# Patient Record
Sex: Female | Born: 1935 | Race: White | Hispanic: No | Smoking: Never smoker
Health system: Southern US, Community
[De-identification: ages and names within clinical notes are randomized; demographics above are authoritative.]

## PROBLEM LIST (undated history)

## (undated) DIAGNOSIS — C801 Malignant (primary) neoplasm, unspecified: Secondary | ICD-10-CM

## (undated) DIAGNOSIS — C50919 Malignant neoplasm of unspecified site of unspecified female breast: Secondary | ICD-10-CM

## (undated) HISTORY — PX: ABDOMINAL HYSTERECTOMY: SUR658

## (undated) HISTORY — PX: MASTECTOMY: SHX3

---

## 2004-08-16 ENCOUNTER — Ambulatory Visit: Payer: Self-pay | Admitting: Unknown Physician Specialty

## 2004-08-19 ENCOUNTER — Ambulatory Visit: Payer: Self-pay | Admitting: Unknown Physician Specialty

## 2007-12-12 ENCOUNTER — Ambulatory Visit: Payer: Self-pay | Admitting: Internal Medicine

## 2008-02-26 ENCOUNTER — Ambulatory Visit: Payer: Self-pay | Admitting: Unknown Physician Specialty

## 2008-04-02 ENCOUNTER — Inpatient Hospital Stay: Payer: Self-pay | Admitting: Cardiology

## 2008-04-02 ENCOUNTER — Other Ambulatory Visit: Payer: Self-pay

## 2008-04-03 ENCOUNTER — Other Ambulatory Visit: Payer: Self-pay

## 2008-04-17 ENCOUNTER — Ambulatory Visit: Payer: Self-pay | Admitting: Cardiology

## 2008-06-24 ENCOUNTER — Ambulatory Visit: Payer: Self-pay | Admitting: Family Medicine

## 2009-04-15 ENCOUNTER — Ambulatory Visit: Payer: Self-pay | Admitting: Internal Medicine

## 2010-01-21 ENCOUNTER — Ambulatory Visit: Payer: Self-pay | Admitting: Internal Medicine

## 2010-08-23 ENCOUNTER — Ambulatory Visit: Payer: Self-pay | Admitting: Internal Medicine

## 2011-02-04 ENCOUNTER — Observation Stay: Payer: Self-pay | Admitting: Internal Medicine

## 2011-02-04 ENCOUNTER — Ambulatory Visit: Payer: Self-pay | Admitting: Internal Medicine

## 2011-04-18 ENCOUNTER — Ambulatory Visit: Payer: Self-pay | Admitting: Oncology

## 2011-04-22 ENCOUNTER — Ambulatory Visit: Payer: Self-pay | Admitting: Internal Medicine

## 2011-04-24 ENCOUNTER — Ambulatory Visit: Payer: Self-pay | Admitting: Oncology

## 2011-05-25 ENCOUNTER — Ambulatory Visit: Payer: Self-pay | Admitting: Oncology

## 2011-06-13 ENCOUNTER — Ambulatory Visit: Payer: Self-pay | Admitting: Unknown Physician Specialty

## 2011-06-17 ENCOUNTER — Telehealth: Payer: Self-pay

## 2011-06-17 ENCOUNTER — Ambulatory Visit: Payer: Self-pay | Admitting: Oncology

## 2011-06-17 DIAGNOSIS — C169 Malignant neoplasm of stomach, unspecified: Secondary | ICD-10-CM

## 2011-06-17 NOTE — Telephone Encounter (Signed)
PT DAUGHTER HAS BEEN NOTIFIED OF PROCEDURE MEDS REVIEWED AND PT INSTRUCTED.

## 2011-06-20 ENCOUNTER — Encounter: Payer: Medicare Other | Admitting: Gastroenterology

## 2011-06-20 ENCOUNTER — Ambulatory Visit (HOSPITAL_COMMUNITY)
Admission: RE | Admit: 2011-06-20 | Discharge: 2011-06-20 | Disposition: A | Discharge status: Home / Self Care | Payer: Medicare Other | Source: Ambulatory Visit | Attending: Gastroenterology | Admitting: Gastroenterology

## 2011-06-20 DIAGNOSIS — R933 Abnormal findings on diagnostic imaging of other parts of digestive tract: Secondary | ICD-10-CM

## 2011-06-20 DIAGNOSIS — C169 Malignant neoplasm of stomach, unspecified: Secondary | ICD-10-CM

## 2011-06-20 DIAGNOSIS — C165 Malignant neoplasm of lesser curvature of stomach, unspecified: Secondary | ICD-10-CM | POA: Insufficient documentation

## 2011-06-25 ENCOUNTER — Ambulatory Visit: Payer: Self-pay | Admitting: Oncology

## 2011-07-03 ENCOUNTER — Inpatient Hospital Stay: Payer: Self-pay | Admitting: Internal Medicine

## 2011-07-12 ENCOUNTER — Ambulatory Visit: Payer: Self-pay | Admitting: Surgery

## 2011-07-25 ENCOUNTER — Ambulatory Visit: Payer: Self-pay | Admitting: Oncology

## 2011-08-25 ENCOUNTER — Ambulatory Visit: Payer: Self-pay | Admitting: Oncology

## 2011-08-31 ENCOUNTER — Ambulatory Visit: Payer: Self-pay | Admitting: Oncology

## 2011-09-24 ENCOUNTER — Ambulatory Visit: Payer: Self-pay | Admitting: Oncology

## 2011-10-25 ENCOUNTER — Ambulatory Visit: Payer: Self-pay | Admitting: Oncology

## 2011-11-04 LAB — COMPREHENSIVE METABOLIC PANEL
Albumin: 3.3 g/dL — ABNORMAL LOW (ref 3.4–5.0)
Anion Gap: 3 — ABNORMAL LOW (ref 7–16)
BUN: 13 mg/dL (ref 7–18)
Bilirubin,Total: 0.3 mg/dL (ref 0.2–1.0)
Chloride: 97 mmol/L — ABNORMAL LOW (ref 98–107)
Creatinine: 0.7 mg/dL (ref 0.60–1.30)
EGFR (African American): >60
Glucose: 122 mg/dL — ABNORMAL HIGH (ref 65–99)
Osmolality: 264 (ref 275–301)
Potassium: 4.1 mmol/L (ref 3.5–5.1)
SGOT(AST): 23 U/L (ref 15–37)
SGPT (ALT): 15 U/L
Sodium: 131 mmol/L — ABNORMAL LOW (ref 136–145)
Total Protein: 6.4 g/dL (ref 6.4–8.2)

## 2011-11-04 LAB — CBC CANCER CENTER
Basophil #: 0 x10 3/mm (ref 0.0–0.1)
Basophil %: 0.2 %
Eosinophil #: 0.1 x10 3/mm (ref 0.0–0.7)
HCT: 28.8 % — ABNORMAL LOW (ref 35.0–47.0)
HGB: 9.9 g/dL — ABNORMAL LOW (ref 12.0–16.0)
Lymphocyte #: 0.6 x10 3/mm — ABNORMAL LOW (ref 1.0–3.6)
Lymphocyte %: 20 %
MCH: 34.8 pg — ABNORMAL HIGH (ref 26.0–34.0)
MCHC: 34.4 g/dL (ref 32.0–36.0)
Monocyte #: 0.3 x10 3/mm (ref 0.0–0.7)
Neutrophil #: 2.2 x10 3/mm (ref 1.4–6.5)
RDW: 15.9 % — ABNORMAL HIGH (ref 11.5–14.5)

## 2011-11-08 ENCOUNTER — Ambulatory Visit: Payer: Self-pay | Admitting: Oncology

## 2011-11-11 LAB — COMPREHENSIVE METABOLIC PANEL
Anion Gap: 3 — ABNORMAL LOW (ref 7–16)
BUN: 8 mg/dL (ref 7–18)
Bilirubin,Total: 0.3 mg/dL (ref 0.2–1.0)
Chloride: 97 mmol/L — ABNORMAL LOW (ref 98–107)
Co2: 33 mmol/L — ABNORMAL HIGH (ref 21–32)
Creatinine: 0.72 mg/dL (ref 0.60–1.30)
EGFR (African American): >60
EGFR (Non-African Amer.): >60
Osmolality: 264 (ref 275–301)
Potassium: 4.2 mmol/L (ref 3.5–5.1)
SGOT(AST): 24 U/L (ref 15–37)
Total Protein: 6.6 g/dL (ref 6.4–8.2)

## 2011-11-11 LAB — CBC CANCER CENTER
Eosinophil #: 0.1 x10 3/mm (ref 0.0–0.7)
HCT: 30.2 % — ABNORMAL LOW (ref 35.0–47.0)
Lymphocyte #: 0.6 x10 3/mm — ABNORMAL LOW (ref 1.0–3.6)
MCH: 35.2 pg — ABNORMAL HIGH (ref 26.0–34.0)
MCHC: 34.7 g/dL (ref 32.0–36.0)
Monocyte #: 0.3 x10 3/mm (ref 0.0–0.7)
Monocyte %: 11.3 %
Neutrophil %: 64.3 %
Platelet: 152 x10 3/mm (ref 150–440)
RBC: 2.98 10*6/uL — ABNORMAL LOW (ref 3.80–5.20)

## 2011-11-11 LAB — MAGNESIUM: Magnesium: 2.3 mg/dL

## 2011-11-18 LAB — BASIC METABOLIC PANEL
Anion Gap: 6 — ABNORMAL LOW (ref 7–16)
Chloride: 94 mmol/L — ABNORMAL LOW (ref 98–107)
Co2: 30 mmol/L (ref 21–32)
Creatinine: 0.82 mg/dL (ref 0.60–1.30)
EGFR (African American): >60
EGFR (Non-African Amer.): >60
Glucose: 146 mg/dL — ABNORMAL HIGH (ref 65–99)
Osmolality: 262 (ref 275–301)

## 2011-11-18 LAB — CBC CANCER CENTER
Basophil #: 0 x10 3/mm (ref 0.0–0.1)
Eosinophil #: 0 x10 3/mm (ref 0.0–0.7)
HCT: 31 % — ABNORMAL LOW (ref 35.0–47.0)
Lymphocyte #: 0.5 x10 3/mm — ABNORMAL LOW (ref 1.0–3.6)
Lymphocyte %: 20.1 %
MCHC: 34.2 g/dL (ref 32.0–36.0)
MCV: 102.4 fL — ABNORMAL HIGH (ref 80–100)
Monocyte %: 13.9 %
RDW: 15 % — ABNORMAL HIGH (ref 11.5–14.5)
WBC: 2.6 x10 3/mm — ABNORMAL LOW (ref 3.6–11.0)

## 2011-11-25 ENCOUNTER — Ambulatory Visit: Payer: Self-pay | Admitting: Oncology

## 2011-11-25 ENCOUNTER — Ambulatory Visit: Payer: Self-pay | Admitting: Internal Medicine

## 2011-12-02 LAB — CBC CANCER CENTER
Basophil #: 0 x10 3/mm (ref 0.0–0.1)
Eosinophil #: 0.1 x10 3/mm (ref 0.0–0.7)
Eosinophil %: 1.3 %
Lymphocyte #: 0.8 x10 3/mm — ABNORMAL LOW (ref 1.0–3.6)
Lymphocyte %: 18.3 %
MCH: 34 pg (ref 26.0–34.0)
MCHC: 33.6 g/dL (ref 32.0–36.0)
MCV: 101.1 fL — ABNORMAL HIGH (ref 80–100)
Monocyte #: 0.5 x10 3/mm (ref 0.0–0.7)
Neutrophil #: 2.8 x10 3/mm (ref 1.4–6.5)
Neutrophil %: 68.7 %
Platelet: 153 x10 3/mm (ref 150–440)
RBC: 3.23 10*6/uL — ABNORMAL LOW (ref 3.80–5.20)
RDW: 14.3 % (ref 11.5–14.5)
WBC: 4.2 x10 3/mm (ref 3.6–11.0)

## 2011-12-02 LAB — BASIC METABOLIC PANEL
Creatinine: 0.77 mg/dL (ref 0.60–1.30)
EGFR (African American): >60
EGFR (Non-African Amer.): >60
Glucose: 87 mg/dL (ref 65–99)
Potassium: 4.2 mmol/L (ref 3.5–5.1)
Sodium: 129 mmol/L — ABNORMAL LOW (ref 136–145)

## 2011-12-07 ENCOUNTER — Inpatient Hospital Stay: Payer: Self-pay | Admitting: Oncology

## 2011-12-08 LAB — BASIC METABOLIC PANEL
Anion Gap: 10 (ref 7–16)
BUN: 7 mg/dL (ref 7–18)
Calcium, Total: 8.3 mg/dL — ABNORMAL LOW (ref 8.5–10.1)
Chloride: 98 mmol/L (ref 98–107)
Co2: 25 mmol/L (ref 21–32)
Creatinine: 0.49 mg/dL — ABNORMAL LOW (ref 0.60–1.30)
Glucose: 85 mg/dL (ref 65–99)
Osmolality: 264 (ref 275–301)
Potassium: 3.9 mmol/L (ref 3.5–5.1)
Sodium: 133 mmol/L — ABNORMAL LOW (ref 136–145)

## 2011-12-08 LAB — CBC WITH DIFFERENTIAL/PLATELET
Basophil #: 0 10*3/uL (ref 0.0–0.1)
Eosinophil %: 2.6 %
HCT: 30.3 % — ABNORMAL LOW (ref 35.0–47.0)
HGB: 10.3 g/dL — ABNORMAL LOW (ref 12.0–16.0)
Lymphocyte #: 0.5 10*3/uL — ABNORMAL LOW (ref 1.0–3.6)
MCH: 34.2 pg — ABNORMAL HIGH (ref 26.0–34.0)
MCHC: 34 g/dL (ref 32.0–36.0)
MCV: 101 fL — ABNORMAL HIGH (ref 80–100)
Monocyte %: 6.7 %
Neutrophil #: 1.8 10*3/uL (ref 1.4–6.5)
RDW: 14.6 % — ABNORMAL HIGH (ref 11.5–14.5)

## 2011-12-09 LAB — CBC WITH DIFFERENTIAL/PLATELET
Basophil #: 0 10*3/uL (ref 0.0–0.1)
Basophil %: 0.2 %
Eosinophil %: 4.4 %
HCT: 28.5 % — ABNORMAL LOW (ref 35.0–47.0)
Lymphocyte #: 0.5 10*3/uL — ABNORMAL LOW (ref 1.0–3.6)
MCH: 33.5 pg (ref 26.0–34.0)
MCHC: 33.7 g/dL (ref 32.0–36.0)
MCV: 100 fL (ref 80–100)
Monocyte #: 0.2 10*3/uL (ref 0.0–0.7)
Neutrophil #: 2.5 10*3/uL (ref 1.4–6.5)
Platelet: 108 10*3/uL — ABNORMAL LOW (ref 150–440)
RBC: 2.86 10*6/uL — ABNORMAL LOW (ref 3.80–5.20)

## 2011-12-09 LAB — COMPREHENSIVE METABOLIC PANEL
Alkaline Phosphatase: 34 U/L — ABNORMAL LOW (ref 50–136)
Anion Gap: 8 (ref 7–16)
BUN: 6 mg/dL — ABNORMAL LOW (ref 7–18)
Bilirubin,Total: 0.2 mg/dL (ref 0.2–1.0)
Chloride: 100 mmol/L (ref 98–107)
Creatinine: 0.5 mg/dL — ABNORMAL LOW (ref 0.60–1.30)
EGFR (African American): >60
EGFR (Non-African Amer.): >60
Osmolality: 267 (ref 275–301)
SGPT (ALT): 14 U/L
Sodium: 135 mmol/L — ABNORMAL LOW (ref 136–145)
Total Protein: 5.7 g/dL — ABNORMAL LOW (ref 6.4–8.2)

## 2011-12-16 LAB — BASIC METABOLIC PANEL
Anion Gap: 6 — ABNORMAL LOW (ref 7–16)
Calcium, Total: 8.4 mg/dL — ABNORMAL LOW (ref 8.5–10.1)
Co2: 31 mmol/L (ref 21–32)
EGFR (Non-African Amer.): >60
Osmolality: 264 (ref 275–301)
Sodium: 132 mmol/L — ABNORMAL LOW (ref 136–145)

## 2011-12-16 LAB — CBC CANCER CENTER
Basophil %: 0.4 %
Eosinophil #: 0.1 x10 3/mm (ref 0.0–0.7)
Eosinophil %: 3.9 %
HCT: 31.2 % — ABNORMAL LOW (ref 35.0–47.0)
HGB: 10.5 g/dL — ABNORMAL LOW (ref 12.0–16.0)
Lymphocyte %: 21.7 %
MCH: 34.1 pg — ABNORMAL HIGH (ref 26.0–34.0)
MCHC: 33.6 g/dL (ref 32.0–36.0)
Monocyte #: 0.3 x10 3/mm (ref 0.0–0.7)
Neutrophil %: 61.3 %
Platelet: 131 x10 3/mm — ABNORMAL LOW (ref 150–440)

## 2011-12-23 ENCOUNTER — Ambulatory Visit: Payer: Self-pay | Admitting: Internal Medicine

## 2011-12-23 ENCOUNTER — Ambulatory Visit: Payer: Self-pay | Admitting: Oncology

## 2011-12-23 LAB — CBC CANCER CENTER
Basophil #: 0 x10 3/mm (ref 0.0–0.1)
Eosinophil %: 1.9 %
HCT: 33.7 % — ABNORMAL LOW (ref 35.0–47.0)
HGB: 11.3 g/dL — ABNORMAL LOW (ref 12.0–16.0)
Lymphocyte %: 19.6 %
Monocyte %: 4.1 %
Neutrophil #: 2 x10 3/mm (ref 1.4–6.5)
RBC: 3.33 10*6/uL — ABNORMAL LOW (ref 3.80–5.20)
RDW: 13.6 % (ref 11.5–14.5)
WBC: 2.6 x10 3/mm — ABNORMAL LOW (ref 3.6–11.0)

## 2011-12-23 LAB — BASIC METABOLIC PANEL
Anion Gap: 6 — ABNORMAL LOW (ref 7–16)
BUN: 7 mg/dL (ref 7–18)
Chloride: 97 mmol/L — ABNORMAL LOW (ref 98–107)
Co2: 31 mmol/L (ref 21–32)
EGFR (African American): >60
EGFR (Non-African Amer.): >60
Glucose: 104 mg/dL — ABNORMAL HIGH (ref 65–99)
Osmolality: 267 (ref 275–301)
Potassium: 3.4 mmol/L — ABNORMAL LOW (ref 3.5–5.1)
Sodium: 134 mmol/L — ABNORMAL LOW (ref 136–145)

## 2011-12-25 IMAGING — NM NUCLEAR MEDICINE CARDIAC MULTIPLE UPTAKE GATED ACQUISITION SCAN
4 series · 24 of 24 positions shown · non-contrast
Comparison: none

REASON FOR EXAM: high risk meds
COMMENTS:

[Series 1000: lao 45-gated · 3.30mm/px · 6 of 24 frames shown]
[frame 3/24]
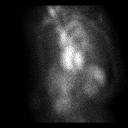
[frame 7/24]
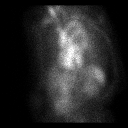
[frame 11/24]
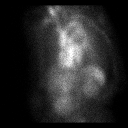
[frame 15/24]
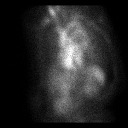
[frame 19/24]
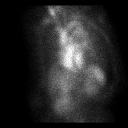
[frame 23/24]
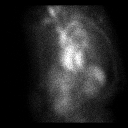

[Series 1000: ant-gated · 3.30mm/px · 6 of 24 frames shown]
[frame 3/24]
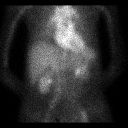
[frame 7/24]
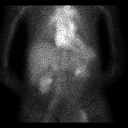
[frame 11/24]
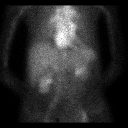
[frame 15/24]
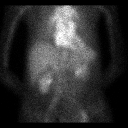
[frame 19/24]
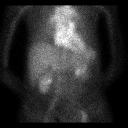
[frame 23/24]
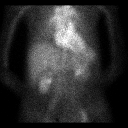

[Series 1000: lao 70-gated · 3.30mm/px · 6 of 24 frames shown]
[frame 3/24]
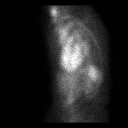
[frame 7/24]
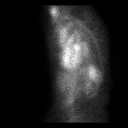
[frame 11/24]
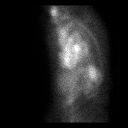
[frame 15/24]
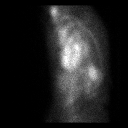
[frame 19/24]
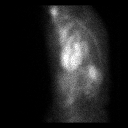
[frame 23/24]
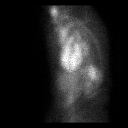

[Series 1000: lao 45-gated (results) · 3.30mm/px · 6 of 24 frames shown]
[frame 3/24]
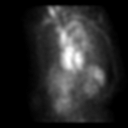
[frame 7/24]
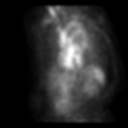
[frame 11/24]
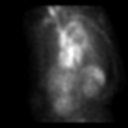
[frame 15/24]
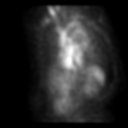
[frame 19/24]
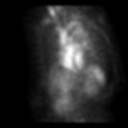
[frame 23/24]
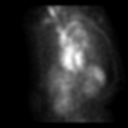

[24 of 24 positions shown; findings below may reference images not displayed]

PROCEDURE:     KNM - KNM REST MUGA SCAN [DATE] OF [DATE]  - [DATE] [DATE] [DATE]  [DATE]

RESULT:     Following intravenous administration of 3 ml PYP and 24.257 mCi
technetium 99m pertechnetate, rest MUGA scan was performed. Review of the
left ventricular cine loop shows no akinetic or dyskinetic myocardial
segments. The left ventricular ejection fraction measures 60.1%, which is in
the normal range.
IMPRESSION: 1.     Normal study. The left ventricular ejection fraction measures 60.1%.

## 2011-12-30 LAB — CBC CANCER CENTER
Basophil %: 0.3 %
Eosinophil %: 2.5 %
HCT: 29 % — ABNORMAL LOW (ref 35.0–47.0)
HGB: 9.9 g/dL — ABNORMAL LOW (ref 12.0–16.0)
Lymphocyte #: 0.5 x10 3/mm — ABNORMAL LOW (ref 1.0–3.6)
MCH: 34.2 pg — ABNORMAL HIGH (ref 26.0–34.0)
MCV: 100.1 fL — ABNORMAL HIGH (ref 80–100)
Monocyte #: 0.2 x10 3/mm (ref 0.0–0.7)
Monocyte %: 9.6 %
Neutrophil #: 1.4 x10 3/mm (ref 1.4–6.5)
Neutrophil %: 66.9 %
Platelet: 92 x10 3/mm — ABNORMAL LOW (ref 150–440)

## 2011-12-30 LAB — COMPREHENSIVE METABOLIC PANEL
Albumin: 3.4 g/dL (ref 3.4–5.0)
Alkaline Phosphatase: 75 U/L (ref 50–136)
BUN: 13 mg/dL (ref 7–18)
Bilirubin,Total: 0.2 mg/dL (ref 0.2–1.0)
Calcium, Total: 8.5 mg/dL (ref 8.5–10.1)
Creatinine: 0.75 mg/dL (ref 0.60–1.30)
EGFR (African American): >60
Glucose: 104 mg/dL — ABNORMAL HIGH (ref 65–99)
Potassium: 3.8 mmol/L (ref 3.5–5.1)
SGOT(AST): 17 U/L (ref 15–37)
SGPT (ALT): 15 U/L
Total Protein: 6.5 g/dL (ref 6.4–8.2)

## 2011-12-31 LAB — CEA: CEA: 0.8 ng/mL (ref 0.0–4.7)

## 2012-01-13 LAB — CBC CANCER CENTER
Basophil #: 0 x10 3/mm (ref 0.0–0.1)
Basophil %: 0.2 %
Eosinophil #: 0 x10 3/mm (ref 0.0–0.7)
HCT: 32.2 % — ABNORMAL LOW (ref 35.0–47.0)
HGB: 10.9 g/dL — ABNORMAL LOW (ref 12.0–16.0)
Lymphocyte #: 0.8 x10 3/mm — ABNORMAL LOW (ref 1.0–3.6)
Lymphocyte %: 17.2 %
MCH: 34.2 pg — ABNORMAL HIGH (ref 26.0–34.0)
MCHC: 34 g/dL (ref 32.0–36.0)
MCV: 100.6 fL — ABNORMAL HIGH (ref 80–100)
Monocyte #: 0.5 x10 3/mm (ref 0.0–0.7)
Neutrophil #: 3.1 x10 3/mm (ref 1.4–6.5)
Neutrophil %: 70.6 %
RBC: 3.2 10*6/uL — ABNORMAL LOW (ref 3.80–5.20)
RDW: 14.3 % (ref 11.5–14.5)

## 2012-01-23 ENCOUNTER — Ambulatory Visit: Payer: Self-pay | Admitting: Oncology

## 2012-01-27 LAB — CBC CANCER CENTER
Basophil %: 0.2 %
Eosinophil #: 0.1 x10 3/mm (ref 0.0–0.7)
Eosinophil %: 1.8 %
HGB: 10.9 g/dL — ABNORMAL LOW (ref 12.0–16.0)
Lymphocyte #: 0.6 x10 3/mm — ABNORMAL LOW (ref 1.0–3.6)
Lymphocyte %: 14 %
MCH: 33.8 pg (ref 26.0–34.0)
MCHC: 33.7 g/dL (ref 32.0–36.0)
Monocyte #: 0.5 x10 3/mm (ref 0.0–0.7)
Monocyte %: 10.5 %
Neutrophil #: 3.1 x10 3/mm (ref 1.4–6.5)
Platelet: 196 x10 3/mm (ref 150–440)
RDW: 13.8 % (ref 11.5–14.5)

## 2012-01-27 LAB — COMPREHENSIVE METABOLIC PANEL
Alkaline Phosphatase: 94 U/L (ref 50–136)
Anion Gap: 4 — ABNORMAL LOW (ref 7–16)
BUN: 11 mg/dL (ref 7–18)
Bilirubin,Total: 0.2 mg/dL (ref 0.2–1.0)
Calcium, Total: 8.6 mg/dL (ref 8.5–10.1)
Chloride: 94 mmol/L — ABNORMAL LOW (ref 98–107)
Co2: 31 mmol/L (ref 21–32)
EGFR (African American): >60
EGFR (Non-African Amer.): >60
Glucose: 95 mg/dL (ref 65–99)
Sodium: 129 mmol/L — ABNORMAL LOW (ref 136–145)
Total Protein: 6.8 g/dL (ref 6.4–8.2)

## 2012-02-10 LAB — COMPREHENSIVE METABOLIC PANEL
Alkaline Phosphatase: 85 U/L (ref 50–136)
Co2: 31 mmol/L (ref 21–32)
Creatinine: 0.69 mg/dL (ref 0.60–1.30)
EGFR (Non-African Amer.): >60
Glucose: 81 mg/dL (ref 65–99)
SGOT(AST): 28 U/L (ref 15–37)
Total Protein: 6.8 g/dL (ref 6.4–8.2)

## 2012-02-10 LAB — CBC CANCER CENTER
Eosinophil #: 0.1 x10 3/mm (ref 0.0–0.7)
HCT: 30.1 % — ABNORMAL LOW (ref 35.0–47.0)
HGB: 10.2 g/dL — ABNORMAL LOW (ref 12.0–16.0)
Lymphocyte #: 0.8 x10 3/mm — ABNORMAL LOW (ref 1.0–3.6)
Lymphocyte %: 17.4 %
MCHC: 33.8 g/dL (ref 32.0–36.0)
Monocyte %: 11.8 %
Neutrophil %: 68 %
RBC: 3.04 10*6/uL — ABNORMAL LOW (ref 3.80–5.20)
RDW: 14 % (ref 11.5–14.5)
WBC: 4.4 x10 3/mm (ref 3.6–11.0)

## 2012-02-22 ENCOUNTER — Ambulatory Visit: Payer: Self-pay | Admitting: Oncology

## 2012-03-02 LAB — COMPREHENSIVE METABOLIC PANEL
Alkaline Phosphatase: 91 U/L (ref 50–136)
Bilirubin,Total: 0.2 mg/dL (ref 0.2–1.0)
Chloride: 92 mmol/L — ABNORMAL LOW (ref 98–107)
Co2: 32 mmol/L (ref 21–32)
EGFR (African American): >60
EGFR (Non-African Amer.): >60
Potassium: 4.5 mmol/L (ref 3.5–5.1)
SGOT(AST): 22 U/L (ref 15–37)
SGPT (ALT): 19 U/L

## 2012-03-02 LAB — CBC CANCER CENTER
Basophil #: 0 x10 3/mm (ref 0.0–0.1)
Eosinophil #: 0.1 x10 3/mm (ref 0.0–0.7)
HGB: 10.9 g/dL — ABNORMAL LOW (ref 12.0–16.0)
Lymphocyte #: 0.8 x10 3/mm — ABNORMAL LOW (ref 1.0–3.6)
Lymphocyte %: 18 %
MCV: 97 fL (ref 80–100)
RDW: 14 % (ref 11.5–14.5)
WBC: 4.5 x10 3/mm (ref 3.6–11.0)

## 2012-03-23 LAB — CBC CANCER CENTER
Basophil %: 0.2 %
HGB: 11.4 g/dL — ABNORMAL LOW (ref 12.0–16.0)
Lymphocyte #: 1 x10 3/mm (ref 1.0–3.6)
MCH: 32.5 pg (ref 26.0–34.0)
MCV: 97 fL (ref 80–100)
Monocyte #: 0.5 x10 3/mm (ref 0.2–0.9)
Monocyte %: 11.5 %
Neutrophil #: 3 x10 3/mm (ref 1.4–6.5)
Neutrophil %: 64.5 %
RBC: 3.51 10*6/uL — ABNORMAL LOW (ref 3.80–5.20)

## 2012-03-23 LAB — COMPREHENSIVE METABOLIC PANEL
Alkaline Phosphatase: 78 U/L (ref 50–136)
Anion Gap: 4 — ABNORMAL LOW (ref 7–16)
BUN: 10 mg/dL (ref 7–18)
Bilirubin,Total: 0.3 mg/dL (ref 0.2–1.0)
Calcium, Total: 8.5 mg/dL (ref 8.5–10.1)
Chloride: 96 mmol/L — ABNORMAL LOW (ref 98–107)
Co2: 30 mmol/L (ref 21–32)
Creatinine: 0.76 mg/dL (ref 0.60–1.30)
EGFR (Non-African Amer.): >60
Glucose: 131 mg/dL — ABNORMAL HIGH (ref 65–99)
Potassium: 4.3 mmol/L (ref 3.5–5.1)
SGOT(AST): 25 U/L (ref 15–37)
Sodium: 130 mmol/L — ABNORMAL LOW (ref 136–145)
Total Protein: 6.9 g/dL (ref 6.4–8.2)

## 2012-03-24 ENCOUNTER — Ambulatory Visit: Payer: Self-pay | Admitting: Oncology

## 2012-04-13 LAB — COMPREHENSIVE METABOLIC PANEL
Alkaline Phosphatase: 90 U/L (ref 50–136)
BUN: 10 mg/dL (ref 7–18)
Bilirubin,Total: 0.2 mg/dL (ref 0.2–1.0)
Calcium, Total: 8.7 mg/dL (ref 8.5–10.1)
Co2: 29 mmol/L (ref 21–32)
Creatinine: 0.7 mg/dL (ref 0.60–1.30)
EGFR (African American): >60
EGFR (Non-African Amer.): >60
Osmolality: 264 (ref 275–301)
Potassium: 4.1 mmol/L (ref 3.5–5.1)
SGPT (ALT): 21 U/L
Total Protein: 6.9 g/dL (ref 6.4–8.2)

## 2012-04-13 LAB — CBC CANCER CENTER
Basophil #: 0 x10 3/mm (ref 0.0–0.1)
Basophil %: 0.2 %
Eosinophil #: 0.1 x10 3/mm (ref 0.0–0.7)
Eosinophil %: 2 %
HGB: 11 g/dL — ABNORMAL LOW (ref 12.0–16.0)
Lymphocyte #: 0.7 x10 3/mm — ABNORMAL LOW (ref 1.0–3.6)
Lymphocyte %: 16.7 %
MCHC: 34.1 g/dL (ref 32.0–36.0)
MCV: 95 fL (ref 80–100)
Neutrophil %: 71.3 %
RDW: 13.7 % (ref 11.5–14.5)
WBC: 4.4 x10 3/mm (ref 3.6–11.0)

## 2012-04-17 LAB — COMPREHENSIVE METABOLIC PANEL
Alkaline Phosphatase: 102 U/L (ref 50–136)
Anion Gap: 10 (ref 7–16)
BUN: 9 mg/dL (ref 7–18)
Bilirubin,Total: 0.4 mg/dL (ref 0.2–1.0)
Chloride: 93 mmol/L — ABNORMAL LOW (ref 98–107)
Co2: 27 mmol/L (ref 21–32)
EGFR (African American): >60
Glucose: 96 mg/dL (ref 65–99)
Osmolality: 259 (ref 275–301)
Potassium: 3.8 mmol/L (ref 3.5–5.1)
SGPT (ALT): 19 U/L
Sodium: 130 mmol/L — ABNORMAL LOW (ref 136–145)
Total Protein: 7.3 g/dL (ref 6.4–8.2)

## 2012-04-17 LAB — CBC CANCER CENTER
Basophil %: 0.2 %
Eosinophil #: 0 x10 3/mm (ref 0.0–0.7)
Eosinophil %: 0.4 %
HCT: 34.3 % — ABNORMAL LOW (ref 35.0–47.0)
Lymphocyte #: 0.6 x10 3/mm — ABNORMAL LOW (ref 1.0–3.6)
Lymphocyte %: 13.5 %
MCH: 31.5 pg (ref 26.0–34.0)
MCHC: 33.6 g/dL (ref 32.0–36.0)
MCV: 94 fL (ref 80–100)
Monocyte #: 0.3 x10 3/mm (ref 0.2–0.9)
Monocyte %: 7.8 %
Neutrophil #: 3.3 x10 3/mm (ref 1.4–6.5)
Neutrophil %: 78.1 %
Platelet: 172 x10 3/mm (ref 150–440)
RBC: 3.66 10*6/uL — ABNORMAL LOW (ref 3.80–5.20)
RDW: 13.8 % (ref 11.5–14.5)
WBC: 4.3 x10 3/mm (ref 3.6–11.0)

## 2012-04-23 ENCOUNTER — Ambulatory Visit: Payer: Self-pay | Admitting: Oncology

## 2012-05-04 LAB — CBC CANCER CENTER
Eosinophil #: 0.1 x10 3/mm (ref 0.0–0.7)
Eosinophil %: 2.7 %
Lymphocyte #: 0.7 x10 3/mm — ABNORMAL LOW (ref 1.0–3.6)
MCH: 31.5 pg (ref 26.0–34.0)
MCHC: 33.6 g/dL (ref 32.0–36.0)
MCV: 94 fL (ref 80–100)
Monocyte #: 0.4 x10 3/mm (ref 0.2–0.9)
Neutrophil %: 65.1 %
Platelet: 154 x10 3/mm (ref 150–440)
RBC: 3.37 10*6/uL — ABNORMAL LOW (ref 3.80–5.20)
RDW: 13.8 % (ref 11.5–14.5)

## 2012-05-04 LAB — COMPREHENSIVE METABOLIC PANEL
Albumin: 3.1 g/dL — ABNORMAL LOW (ref 3.4–5.0)
Alkaline Phosphatase: 89 U/L (ref 50–136)
Calcium, Total: 8.5 mg/dL (ref 8.5–10.1)
Co2: 31 mmol/L (ref 21–32)
EGFR (Non-African Amer.): >60
Glucose: 86 mg/dL (ref 65–99)
Osmolality: 265 (ref 275–301)
SGPT (ALT): 19 U/L
Sodium: 133 mmol/L — ABNORMAL LOW (ref 136–145)

## 2012-05-24 ENCOUNTER — Ambulatory Visit: Payer: Self-pay | Admitting: Oncology

## 2012-05-25 LAB — COMPREHENSIVE METABOLIC PANEL
Anion Gap: 4 — ABNORMAL LOW (ref 7–16)
BUN: 10 mg/dL (ref 7–18)
Calcium, Total: 8.5 mg/dL (ref 8.5–10.1)
Chloride: 98 mmol/L (ref 98–107)
Co2: 31 mmol/L (ref 21–32)
EGFR (African American): >60
EGFR (Non-African Amer.): >60
Osmolality: 264 (ref 275–301)
Potassium: 4.2 mmol/L (ref 3.5–5.1)
SGOT(AST): 19 U/L (ref 15–37)
Total Protein: 6.7 g/dL (ref 6.4–8.2)

## 2012-05-25 LAB — CBC CANCER CENTER
Basophil #: 0 x10 3/mm (ref 0.0–0.1)
Eosinophil #: 0.1 x10 3/mm (ref 0.0–0.7)
Eosinophil %: 1.4 %
HCT: 32.2 % — ABNORMAL LOW (ref 35.0–47.0)
Lymphocyte #: 0.7 x10 3/mm — ABNORMAL LOW (ref 1.0–3.6)
Lymphocyte %: 14.8 %
MCV: 93 fL (ref 80–100)
Monocyte %: 8.9 %
Neutrophil #: 3.5 x10 3/mm (ref 1.4–6.5)
Platelet: 155 x10 3/mm (ref 150–440)
RDW: 14.5 % (ref 11.5–14.5)

## 2012-06-15 LAB — COMPREHENSIVE METABOLIC PANEL
Albumin: 3.2 g/dL — ABNORMAL LOW (ref 3.4–5.0)
Alkaline Phosphatase: 84 U/L (ref 50–136)
Calcium, Total: 8.5 mg/dL (ref 8.5–10.1)
Chloride: 95 mmol/L — ABNORMAL LOW (ref 98–107)
Creatinine: 0.68 mg/dL (ref 0.60–1.30)
Glucose: 142 mg/dL — ABNORMAL HIGH (ref 65–99)
Osmolality: 260 (ref 275–301)
SGOT(AST): 24 U/L (ref 15–37)
Sodium: 129 mmol/L — ABNORMAL LOW (ref 136–145)

## 2012-06-15 LAB — CBC CANCER CENTER
Basophil #: 0 x10 3/mm (ref 0.0–0.1)
Eosinophil #: 0.1 x10 3/mm (ref 0.0–0.7)
HCT: 33.4 % — ABNORMAL LOW (ref 35.0–47.0)
HGB: 11.3 g/dL — ABNORMAL LOW (ref 12.0–16.0)
Lymphocyte #: 0.6 x10 3/mm — ABNORMAL LOW (ref 1.0–3.6)
Lymphocyte %: 14.7 %
MCV: 93 fL (ref 80–100)
Monocyte %: 8.1 %
Neutrophil #: 3 x10 3/mm (ref 1.4–6.5)
RBC: 3.59 10*6/uL — ABNORMAL LOW (ref 3.80–5.20)
WBC: 4 x10 3/mm (ref 3.6–11.0)

## 2012-06-18 LAB — URINALYSIS, COMPLETE
Blood: NEGATIVE
Glucose,UR: NEGATIVE mg/dL (ref 0–75)
Leukocyte Esterase: NEGATIVE
Nitrite: NEGATIVE
Ph: 6 (ref 4.5–8.0)
Protein: NEGATIVE
RBC,UR: NONE SEEN /HPF (ref 0–5)

## 2012-06-18 LAB — CBC CANCER CENTER
Basophil #: 0.1 x10 3/mm (ref 0.0–0.1)
Eosinophil %: 1.1 %
HCT: 33.4 % — ABNORMAL LOW (ref 35.0–47.0)
Lymphocyte #: 0.7 x10 3/mm — ABNORMAL LOW (ref 1.0–3.6)
Lymphocyte %: 14.6 %
MCH: 31.6 pg (ref 26.0–34.0)
MCHC: 34.1 g/dL (ref 32.0–36.0)
MCV: 93 fL (ref 80–100)
Monocyte #: 0.4 x10 3/mm (ref 0.2–0.9)
Monocyte %: 7.5 %
Neutrophil #: 3.6 x10 3/mm (ref 1.4–6.5)
RDW: 14.7 % — ABNORMAL HIGH (ref 11.5–14.5)
WBC: 4.8 x10 3/mm (ref 3.6–11.0)

## 2012-06-18 LAB — COMPREHENSIVE METABOLIC PANEL
Alkaline Phosphatase: 94 U/L (ref 50–136)
Anion Gap: 6 — ABNORMAL LOW (ref 7–16)
BUN: 7 mg/dL (ref 7–18)
Bilirubin,Total: 0.3 mg/dL (ref 0.2–1.0)
Co2: 29 mmol/L (ref 21–32)
EGFR (African American): >60
EGFR (Non-African Amer.): >60
Glucose: 104 mg/dL — ABNORMAL HIGH (ref 65–99)
Potassium: 3.9 mmol/L (ref 3.5–5.1)
SGOT(AST): 21 U/L (ref 15–37)
SGPT (ALT): 17 U/L (ref 12–78)
Sodium: 133 mmol/L — ABNORMAL LOW (ref 136–145)
Total Protein: 6.9 g/dL (ref 6.4–8.2)

## 2012-06-24 ENCOUNTER — Ambulatory Visit: Payer: Self-pay | Admitting: Oncology

## 2012-07-06 LAB — COMPREHENSIVE METABOLIC PANEL
Anion Gap: 4 — ABNORMAL LOW (ref 7–16)
BUN: 9 mg/dL (ref 7–18)
Bilirubin,Total: 0.3 mg/dL (ref 0.2–1.0)
Chloride: 98 mmol/L (ref 98–107)
Creatinine: 0.79 mg/dL (ref 0.60–1.30)
EGFR (African American): >60
Osmolality: 267 (ref 275–301)
Potassium: 3.8 mmol/L (ref 3.5–5.1)
SGOT(AST): 20 U/L (ref 15–37)
Sodium: 134 mmol/L — ABNORMAL LOW (ref 136–145)
Total Protein: 6.6 g/dL (ref 6.4–8.2)

## 2012-07-06 LAB — CBC CANCER CENTER
Basophil #: 0 x10 3/mm (ref 0.0–0.1)
Eosinophil #: 0.1 x10 3/mm (ref 0.0–0.7)
Eosinophil %: 1.7 %
Lymphocyte %: 16.2 %
MCHC: 32.9 g/dL (ref 32.0–36.0)
Monocyte #: 0.4 x10 3/mm (ref 0.2–0.9)
Monocyte %: 10.4 %
Neutrophil #: 2.4 x10 3/mm (ref 1.4–6.5)
Neutrophil %: 71.5 %
RBC: 3.39 10*6/uL — ABNORMAL LOW (ref 3.80–5.20)
RDW: 14.8 % — ABNORMAL HIGH (ref 11.5–14.5)

## 2012-07-24 ENCOUNTER — Ambulatory Visit: Payer: Self-pay | Admitting: Oncology

## 2012-07-27 LAB — CBC CANCER CENTER
Basophil #: 0 x10 3/mm (ref 0.0–0.1)
Basophil %: 0.3 %
Eosinophil #: 0.1 x10 3/mm (ref 0.0–0.7)
HCT: 33.8 % — ABNORMAL LOW (ref 35.0–47.0)
HGB: 11.3 g/dL — ABNORMAL LOW (ref 12.0–16.0)
MCH: 31.5 pg (ref 26.0–34.0)
MCHC: 33.5 g/dL (ref 32.0–36.0)
Monocyte #: 0.3 x10 3/mm (ref 0.2–0.9)
Neutrophil #: 2.4 x10 3/mm (ref 1.4–6.5)
Neutrophil %: 69 %
RDW: 14.8 % — ABNORMAL HIGH (ref 11.5–14.5)

## 2012-07-27 LAB — COMPREHENSIVE METABOLIC PANEL
Anion Gap: 3 — ABNORMAL LOW (ref 7–16)
BUN: 11 mg/dL (ref 7–18)
Calcium, Total: 8.5 mg/dL (ref 8.5–10.1)
Chloride: 97 mmol/L — ABNORMAL LOW (ref 98–107)
Co2: 32 mmol/L (ref 21–32)
Creatinine: 0.71 mg/dL (ref 0.60–1.30)
EGFR (African American): >60
EGFR (Non-African Amer.): >60
Glucose: 104 mg/dL — ABNORMAL HIGH (ref 65–99)
Osmolality: 264 (ref 275–301)
Potassium: 4.3 mmol/L (ref 3.5–5.1)
SGOT(AST): 23 U/L (ref 15–37)
SGPT (ALT): 20 U/L (ref 12–78)
Sodium: 132 mmol/L — ABNORMAL LOW (ref 136–145)
Total Protein: 6.9 g/dL (ref 6.4–8.2)

## 2012-08-17 LAB — CBC CANCER CENTER
Basophil #: 0 x10 3/mm (ref 0.0–0.1)
Eosinophil #: 0.1 x10 3/mm (ref 0.0–0.7)
Lymphocyte #: 0.5 x10 3/mm — ABNORMAL LOW (ref 1.0–3.6)
MCH: 31.3 pg (ref 26.0–34.0)
MCHC: 33.3 g/dL (ref 32.0–36.0)
Monocyte #: 0.3 x10 3/mm (ref 0.2–0.9)
Neutrophil #: 2.4 x10 3/mm (ref 1.4–6.5)
Neutrophil %: 72.6 %
Platelet: 132 x10 3/mm — ABNORMAL LOW (ref 150–440)
RBC: 3.41 10*6/uL — ABNORMAL LOW (ref 3.80–5.20)

## 2012-08-17 LAB — COMPREHENSIVE METABOLIC PANEL
Alkaline Phosphatase: 76 U/L (ref 50–136)
Bilirubin,Total: 0.2 mg/dL (ref 0.2–1.0)
Calcium, Total: 8.6 mg/dL (ref 8.5–10.1)
Chloride: 99 mmol/L (ref 98–107)
Co2: 32 mmol/L (ref 21–32)
Creatinine: 0.87 mg/dL (ref 0.60–1.30)
EGFR (Non-African Amer.): >60
Osmolality: 268 (ref 275–301)
Potassium: 3.9 mmol/L (ref 3.5–5.1)
SGOT(AST): 21 U/L (ref 15–37)
SGPT (ALT): 15 U/L (ref 12–78)

## 2012-08-22 LAB — BASIC METABOLIC PANEL
BUN: 7 mg/dL (ref 7–18)
Co2: 30 mmol/L (ref 21–32)
Creatinine: 0.81 mg/dL (ref 0.60–1.30)
EGFR (African American): >60
EGFR (Non-African Amer.): >60
Glucose: 96 mg/dL (ref 65–99)
Sodium: 133 mmol/L — ABNORMAL LOW (ref 136–145)

## 2012-08-22 LAB — CBC CANCER CENTER
Basophil %: 0.3 %
Eosinophil %: 1.7 %
Lymphocyte #: 0.7 x10 3/mm — ABNORMAL LOW (ref 1.0–3.6)
MCH: 31.4 pg (ref 26.0–34.0)
MCHC: 33.7 g/dL (ref 32.0–36.0)
Monocyte #: 0.3 x10 3/mm (ref 0.2–0.9)
Neutrophil %: 65.5 %
Platelet: 153 x10 3/mm (ref 150–440)
RBC: 3.7 10*6/uL — ABNORMAL LOW (ref 3.80–5.20)
WBC: 3 x10 3/mm — ABNORMAL LOW (ref 3.6–11.0)

## 2012-08-24 ENCOUNTER — Ambulatory Visit: Payer: Self-pay | Admitting: Oncology

## 2012-09-03 ENCOUNTER — Ambulatory Visit: Payer: Self-pay | Admitting: Oncology

## 2012-09-04 ENCOUNTER — Ambulatory Visit: Payer: Self-pay | Admitting: Unknown Physician Specialty

## 2012-09-05 LAB — PATHOLOGY REPORT

## 2012-09-07 LAB — COMPREHENSIVE METABOLIC PANEL
Albumin: 3.2 g/dL — ABNORMAL LOW (ref 3.4–5.0)
Alkaline Phosphatase: 81 U/L (ref 50–136)
Anion Gap: 4 — ABNORMAL LOW (ref 7–16)
BUN: 5 mg/dL — ABNORMAL LOW (ref 7–18)
Bilirubin,Total: 0.2 mg/dL (ref 0.2–1.0)
Creatinine: 0.91 mg/dL (ref 0.60–1.30)
EGFR (Non-African Amer.): >60
Glucose: 98 mg/dL (ref 65–99)
Osmolality: 265 (ref 275–301)
SGPT (ALT): 18 U/L (ref 12–78)
Total Protein: 6.7 g/dL (ref 6.4–8.2)

## 2012-09-07 LAB — CBC CANCER CENTER
Basophil #: 0 x10 3/mm (ref 0.0–0.1)
HCT: 31.4 % — ABNORMAL LOW (ref 35.0–47.0)
HGB: 10.8 g/dL — ABNORMAL LOW (ref 12.0–16.0)
Lymphocyte #: 0.7 x10 3/mm — ABNORMAL LOW (ref 1.0–3.6)
Lymphocyte %: 18.1 %
MCHC: 34.5 g/dL (ref 32.0–36.0)
Monocyte %: 10.6 %
Neutrophil %: 68.2 %
RBC: 3.36 10*6/uL — ABNORMAL LOW (ref 3.80–5.20)
RDW: 14.1 % (ref 11.5–14.5)
WBC: 3.7 x10 3/mm (ref 3.6–11.0)

## 2012-09-14 LAB — CBC CANCER CENTER
Basophil #: 0 x10 3/mm (ref 0.0–0.1)
Basophil %: 0.2 %
Eosinophil #: 0 x10 3/mm (ref 0.0–0.7)
HCT: 32.6 % — ABNORMAL LOW (ref 35.0–47.0)
HGB: 11.1 g/dL — ABNORMAL LOW (ref 12.0–16.0)
MCH: 31.6 pg (ref 26.0–34.0)
MCHC: 34.1 g/dL (ref 32.0–36.0)
MCV: 93 fL (ref 80–100)
Monocyte #: 0.3 x10 3/mm (ref 0.2–0.9)
Neutrophil #: 4.3 x10 3/mm (ref 1.4–6.5)
Platelet: 153 x10 3/mm (ref 150–440)
RDW: 14 % (ref 11.5–14.5)
WBC: 5 x10 3/mm (ref 3.6–11.0)

## 2012-09-14 LAB — URINALYSIS, COMPLETE
Bacteria: NEGATIVE
Glucose,UR: NEGATIVE mg/dL (ref 0–75)
Ketone: NEGATIVE
Nitrite: NEGATIVE
Protein: NEGATIVE
Specific Gravity: 1.005 (ref 1.003–1.030)
Squamous Epithelial: NONE SEEN

## 2012-09-14 LAB — COMPREHENSIVE METABOLIC PANEL
Albumin: 3.5 g/dL (ref 3.4–5.0)
Alkaline Phosphatase: 89 U/L (ref 50–136)
Bilirubin,Total: 0.3 mg/dL (ref 0.2–1.0)
Chloride: 96 mmol/L — ABNORMAL LOW (ref 98–107)
Co2: 29 mmol/L (ref 21–32)
Creatinine: 0.75 mg/dL (ref 0.60–1.30)
EGFR (Non-African Amer.): >60
Glucose: 106 mg/dL — ABNORMAL HIGH (ref 65–99)
Osmolality: 263 (ref 275–301)
Sodium: 132 mmol/L — ABNORMAL LOW (ref 136–145)
Total Protein: 7.2 g/dL (ref 6.4–8.2)

## 2012-09-23 ENCOUNTER — Ambulatory Visit: Payer: Self-pay | Admitting: Oncology

## 2012-09-28 LAB — CBC CANCER CENTER
Basophil #: 0 x10 3/mm (ref 0.0–0.1)
Basophil %: 0.4 %
Eosinophil #: 0.1 x10 3/mm (ref 0.0–0.7)
HGB: 11.1 g/dL — ABNORMAL LOW (ref 12.0–16.0)
Lymphocyte %: 14.1 %
MCHC: 33.3 g/dL (ref 32.0–36.0)
Monocyte %: 9.4 %
Neutrophil %: 74.2 %
Platelet: 139 x10 3/mm — ABNORMAL LOW (ref 150–440)
RBC: 3.6 10*6/uL — ABNORMAL LOW (ref 3.80–5.20)

## 2012-09-28 LAB — COMPREHENSIVE METABOLIC PANEL
Albumin: 3.3 g/dL — ABNORMAL LOW (ref 3.4–5.0)
Alkaline Phosphatase: 67 U/L (ref 50–136)
Bilirubin,Total: 0.3 mg/dL (ref 0.2–1.0)
Chloride: 97 mmol/L — ABNORMAL LOW (ref 98–107)
Creatinine: 0.79 mg/dL (ref 0.60–1.30)
SGPT (ALT): 19 U/L (ref 12–78)
Sodium: 132 mmol/L — ABNORMAL LOW (ref 136–145)
Total Protein: 6.6 g/dL (ref 6.4–8.2)

## 2012-09-28 LAB — IRON AND TIBC
Iron Bind.Cap.(Total): 254 ug/dL (ref 250–450)
Iron Saturation: 19 %
Unbound Iron-Bind.Cap.: 207 ug/dL

## 2012-09-28 LAB — FERRITIN: Ferritin (ARMC): 20 ng/mL (ref 8–388)

## 2012-10-19 LAB — CBC CANCER CENTER
Basophil %: 0.3 %
Eosinophil #: 0.1 x10 3/mm (ref 0.0–0.7)
HCT: 30.9 % — ABNORMAL LOW (ref 35.0–47.0)
HGB: 10.2 g/dL — ABNORMAL LOW (ref 12.0–16.0)
Lymphocyte %: 16.6 %
MCH: 30.5 pg (ref 26.0–34.0)
MCHC: 32.9 g/dL (ref 32.0–36.0)
Monocyte #: 0.4 x10 3/mm (ref 0.2–0.9)
Monocyte %: 9.9 %
Neutrophil #: 3 x10 3/mm (ref 1.4–6.5)

## 2012-10-19 LAB — COMPREHENSIVE METABOLIC PANEL
Albumin: 3.2 g/dL — ABNORMAL LOW (ref 3.4–5.0)
Alkaline Phosphatase: 77 U/L (ref 50–136)
BUN: 10 mg/dL (ref 7–18)
Calcium, Total: 8.5 mg/dL (ref 8.5–10.1)
Co2: 31 mmol/L (ref 21–32)
EGFR (Non-African Amer.): >60
Osmolality: 267 (ref 275–301)
SGOT(AST): 19 U/L (ref 15–37)
Sodium: 134 mmol/L — ABNORMAL LOW (ref 136–145)

## 2012-10-22 ENCOUNTER — Inpatient Hospital Stay: Payer: Self-pay | Admitting: Oncology

## 2012-10-22 LAB — CBC CANCER CENTER
Basophil %: 0.2 %
Eosinophil #: 0 x10 3/mm (ref 0.0–0.7)
Eosinophil %: 0 %
Lymphocyte #: 0.3 x10 3/mm — ABNORMAL LOW (ref 1.0–3.6)
MCH: 31.4 pg (ref 26.0–34.0)
MCV: 92 fL (ref 80–100)
Monocyte #: 0.3 x10 3/mm (ref 0.2–0.9)
Monocyte %: 9.7 %
Neutrophil #: 2.7 x10 3/mm (ref 1.4–6.5)
Platelet: 109 x10 3/mm — ABNORMAL LOW (ref 150–440)
RBC: 3.33 10*6/uL — ABNORMAL LOW (ref 3.80–5.20)
WBC: 3.3 x10 3/mm — ABNORMAL LOW (ref 3.6–11.0)

## 2012-10-22 LAB — COMPREHENSIVE METABOLIC PANEL
Albumin: 3.3 g/dL — ABNORMAL LOW (ref 3.4–5.0)
Anion Gap: 10 (ref 7–16)
BUN: 10 mg/dL (ref 7–18)
Chloride: 92 mmol/L — ABNORMAL LOW (ref 98–107)
EGFR (African American): >60
Glucose: 108 mg/dL — ABNORMAL HIGH (ref 65–99)
Osmolality: 259 (ref 275–301)
SGOT(AST): 28 U/L (ref 15–37)
Sodium: 129 mmol/L — ABNORMAL LOW (ref 136–145)
Total Protein: 6.7 g/dL (ref 6.4–8.2)

## 2012-10-22 LAB — RAPID INFLUENZA A&B ANTIGENS

## 2012-10-23 LAB — CBC WITH DIFFERENTIAL/PLATELET
Basophil %: 0.1 %
Eosinophil %: 0 %
HCT: 26.6 % — ABNORMAL LOW (ref 35.0–47.0)
HGB: 9 g/dL — ABNORMAL LOW (ref 12.0–16.0)
Lymphocyte #: 0.3 10*3/uL — ABNORMAL LOW (ref 1.0–3.6)
MCHC: 34 g/dL (ref 32.0–36.0)
MCV: 92 fL (ref 80–100)
Monocyte #: 0.3 x10 3/mm (ref 0.2–0.9)
Neutrophil #: 2.6 10*3/uL (ref 1.4–6.5)
RBC: 2.9 10*6/uL — ABNORMAL LOW (ref 3.80–5.20)
RDW: 14.2 % (ref 11.5–14.5)
WBC: 3.2 10*3/uL — ABNORMAL LOW (ref 3.6–11.0)

## 2012-10-23 LAB — SODIUM: Sodium: 135 mmol/L — ABNORMAL LOW (ref 136–145)

## 2012-10-24 ENCOUNTER — Ambulatory Visit: Payer: Self-pay | Admitting: Oncology

## 2012-10-24 LAB — BASIC METABOLIC PANEL WITH GFR
Anion Gap: 5 — ABNORMAL LOW (ref 7–16)
BUN: 6 mg/dL — ABNORMAL LOW (ref 7–18)
Calcium, Total: 7.5 mg/dL — ABNORMAL LOW (ref 8.5–10.1)
Chloride: 106 mmol/L (ref 98–107)
Co2: 29 mmol/L (ref 21–32)
Creatinine: 0.66 mg/dL (ref 0.60–1.30)
EGFR (African American): >60
EGFR (Non-African Amer.): >60
Glucose: 83 mg/dL (ref 65–99)
Osmolality: 276 (ref 275–301)
Potassium: 3.8 mmol/L (ref 3.5–5.1)
Sodium: 140 mmol/L (ref 136–145)

## 2012-10-24 LAB — CLOSTRIDIUM DIFFICILE BY PCR

## 2012-10-25 LAB — CBC WITH DIFFERENTIAL/PLATELET
Basophil %: 0.3 %
Eosinophil #: 0 10*3/uL (ref 0.0–0.7)
Eosinophil %: 1.1 %
HGB: 8.7 g/dL — ABNORMAL LOW (ref 12.0–16.0)
Lymphocyte #: 0.6 10*3/uL — ABNORMAL LOW (ref 1.0–3.6)
MCHC: 33.6 g/dL (ref 32.0–36.0)
Monocyte #: 0.3 x10 3/mm (ref 0.2–0.9)
Monocyte %: 11.5 %
Neutrophil #: 1.3 10*3/uL — ABNORMAL LOW (ref 1.4–6.5)
Neutrophil %: 59.9 %
RDW: 14.5 % (ref 11.5–14.5)
WBC: 2.2 10*3/uL — ABNORMAL LOW (ref 3.6–11.0)

## 2012-10-25 LAB — BASIC METABOLIC PANEL
BUN: 5 mg/dL — ABNORMAL LOW (ref 7–18)
Calcium, Total: 7.2 mg/dL — ABNORMAL LOW (ref 8.5–10.1)
Chloride: 106 mmol/L (ref 98–107)
EGFR (Non-African Amer.): >60
Glucose: 93 mg/dL (ref 65–99)
Potassium: 3 mmol/L — ABNORMAL LOW (ref 3.5–5.1)
Sodium: 139 mmol/L (ref 136–145)

## 2012-10-26 LAB — BASIC METABOLIC PANEL
BUN: 4 mg/dL — ABNORMAL LOW (ref 7–18)
Calcium, Total: 7.3 mg/dL — ABNORMAL LOW (ref 8.5–10.1)
Chloride: 103 mmol/L (ref 98–107)
Co2: 29 mmol/L (ref 21–32)
Glucose: 97 mg/dL (ref 65–99)
Osmolality: 269 (ref 275–301)
Sodium: 136 mmol/L (ref 136–145)

## 2012-11-09 LAB — COMPREHENSIVE METABOLIC PANEL
Albumin: 3.2 g/dL — ABNORMAL LOW (ref 3.4–5.0)
BUN: 12 mg/dL (ref 7–18)
Calcium, Total: 8.5 mg/dL (ref 8.5–10.1)
Creatinine: 0.81 mg/dL (ref 0.60–1.30)
EGFR (African American): >60
Glucose: 117 mg/dL — ABNORMAL HIGH (ref 65–99)
SGOT(AST): 19 U/L (ref 15–37)
Total Protein: 6.9 g/dL (ref 6.4–8.2)

## 2012-11-09 LAB — CBC CANCER CENTER
Basophil #: 0 x10 3/mm (ref 0.0–0.1)
Eosinophil %: 2.4 %
HGB: 10 g/dL — ABNORMAL LOW (ref 12.0–16.0)
Lymphocyte #: 0.6 x10 3/mm — ABNORMAL LOW (ref 1.0–3.6)
Lymphocyte %: 18 %
MCH: 30.9 pg (ref 26.0–34.0)
MCV: 90 fL (ref 80–100)
Monocyte #: 0.3 x10 3/mm (ref 0.2–0.9)
Monocyte %: 10.4 %
Neutrophil %: 68.8 %
Platelet: 182 x10 3/mm (ref 150–440)
RBC: 3.25 10*6/uL — ABNORMAL LOW (ref 3.80–5.20)
WBC: 3.3 x10 3/mm — ABNORMAL LOW (ref 3.6–11.0)

## 2012-11-24 ENCOUNTER — Ambulatory Visit: Payer: Self-pay | Admitting: Oncology

## 2012-11-30 LAB — COMPREHENSIVE METABOLIC PANEL
Anion Gap: 6 — ABNORMAL LOW (ref 7–16)
BUN: 13 mg/dL (ref 7–18)
Bilirubin,Total: 0.3 mg/dL (ref 0.2–1.0)
Calcium, Total: 8.5 mg/dL (ref 8.5–10.1)
Co2: 28 mmol/L (ref 21–32)
Creatinine: 0.94 mg/dL (ref 0.60–1.30)
EGFR (African American): >60
EGFR (Non-African Amer.): 58 — ABNORMAL LOW
Potassium: 3.8 mmol/L (ref 3.5–5.1)
SGOT(AST): 20 U/L (ref 15–37)

## 2012-11-30 LAB — CBC CANCER CENTER
Basophil %: 0.3 %
Eosinophil #: 0.1 x10 3/mm (ref 0.0–0.7)
Eosinophil %: 2.3 %
Lymphocyte %: 17.2 %
MCH: 30.4 pg (ref 26.0–34.0)
Monocyte %: 10.7 %
RDW: 14.7 % — ABNORMAL HIGH (ref 11.5–14.5)
WBC: 3.6 x10 3/mm (ref 3.6–11.0)

## 2012-12-21 LAB — CBC CANCER CENTER
Basophil #: 0 x10 3/mm (ref 0.0–0.1)
Basophil %: 0.3 %
HCT: 29.9 % — ABNORMAL LOW (ref 35.0–47.0)
Lymphocyte %: 13.8 %
MCV: 90 fL (ref 80–100)
Monocyte #: 0.4 x10 3/mm (ref 0.2–0.9)
Monocyte %: 9.7 %
Neutrophil #: 2.9 x10 3/mm (ref 1.4–6.5)
Platelet: 146 x10 3/mm — ABNORMAL LOW (ref 150–440)
RBC: 3.33 10*6/uL — ABNORMAL LOW (ref 3.80–5.20)
RDW: 14.5 % (ref 11.5–14.5)
WBC: 4 x10 3/mm (ref 3.6–11.0)

## 2012-12-21 LAB — COMPREHENSIVE METABOLIC PANEL
Albumin: 3.2 g/dL — ABNORMAL LOW (ref 3.4–5.0)
Alkaline Phosphatase: 101 U/L (ref 50–136)
BUN: 13 mg/dL (ref 7–18)
Calcium, Total: 8.4 mg/dL — ABNORMAL LOW (ref 8.5–10.1)
Chloride: 99 mmol/L (ref 98–107)
Co2: 30 mmol/L (ref 21–32)
Creatinine: 0.93 mg/dL (ref 0.60–1.30)
EGFR (African American): >60
EGFR (Non-African Amer.): 58 — ABNORMAL LOW
Glucose: 118 mg/dL — ABNORMAL HIGH (ref 65–99)
Potassium: 3.9 mmol/L (ref 3.5–5.1)
SGOT(AST): 23 U/L (ref 15–37)
Sodium: 136 mmol/L (ref 136–145)

## 2012-12-22 ENCOUNTER — Ambulatory Visit: Payer: Self-pay | Admitting: Oncology

## 2013-01-11 LAB — CBC CANCER CENTER
Basophil %: 0.3 %
Eosinophil #: 0.1 x10 3/mm (ref 0.0–0.7)
Eosinophil %: 3.2 %
HCT: 30.8 % — ABNORMAL LOW (ref 35.0–47.0)
Lymphocyte #: 0.5 x10 3/mm — ABNORMAL LOW (ref 1.0–3.6)
MCHC: 33.8 g/dL (ref 32.0–36.0)
MCV: 90 fL (ref 80–100)
Monocyte #: 0.4 x10 3/mm (ref 0.2–0.9)
Monocyte %: 10.9 %
Neutrophil #: 2.4 x10 3/mm (ref 1.4–6.5)
Platelet: 151 x10 3/mm (ref 150–440)
RDW: 15.2 % — ABNORMAL HIGH (ref 11.5–14.5)
WBC: 3.5 x10 3/mm — ABNORMAL LOW (ref 3.6–11.0)

## 2013-01-11 LAB — COMPREHENSIVE METABOLIC PANEL
Albumin: 3.2 g/dL — ABNORMAL LOW (ref 3.4–5.0)
Alkaline Phosphatase: 109 U/L (ref 50–136)
BUN: 10 mg/dL (ref 7–18)
Bilirubin,Total: 0.2 mg/dL (ref 0.2–1.0)
Calcium, Total: 8.5 mg/dL (ref 8.5–10.1)
Chloride: 98 mmol/L (ref 98–107)
Co2: 32 mmol/L (ref 21–32)
Creatinine: 0.94 mg/dL (ref 0.60–1.30)
EGFR (Non-African Amer.): 58 — ABNORMAL LOW
Osmolality: 271 (ref 275–301)
Potassium: 4 mmol/L (ref 3.5–5.1)
SGOT(AST): 25 U/L (ref 15–37)
SGPT (ALT): 18 U/L (ref 12–78)

## 2013-01-22 ENCOUNTER — Ambulatory Visit: Payer: Self-pay | Admitting: Oncology

## 2013-02-01 LAB — CBC CANCER CENTER
Basophil #: 0 x10 3/mm (ref 0.0–0.1)
Eosinophil #: 0.1 x10 3/mm (ref 0.0–0.7)
Eosinophil %: 2.1 %
HCT: 31 % — ABNORMAL LOW (ref 35.0–47.0)
HGB: 10.5 g/dL — ABNORMAL LOW (ref 12.0–16.0)
Lymphocyte #: 0.6 x10 3/mm — ABNORMAL LOW (ref 1.0–3.6)
Lymphocyte %: 17.2 %
MCH: 30.1 pg (ref 26.0–34.0)
MCHC: 33.8 g/dL (ref 32.0–36.0)
MCV: 89 fL (ref 80–100)
Neutrophil #: 2.6 x10 3/mm (ref 1.4–6.5)
Neutrophil %: 69.5 %
RBC: 3.48 10*6/uL — ABNORMAL LOW (ref 3.80–5.20)
RDW: 15 % — ABNORMAL HIGH (ref 11.5–14.5)

## 2013-02-01 LAB — IRON AND TIBC
Iron Bind.Cap.(Total): 311 ug/dL (ref 250–450)
Iron Saturation: 16 %
Unbound Iron-Bind.Cap.: 260 ug/dL

## 2013-02-01 LAB — FERRITIN: Ferritin (ARMC): 19 ng/mL (ref 8–388)

## 2013-02-21 ENCOUNTER — Ambulatory Visit: Payer: Self-pay | Admitting: Oncology

## 2013-02-22 LAB — CBC CANCER CENTER
Basophil #: 0 x10 3/mm (ref 0.0–0.1)
Basophil %: 0.5 %
Eosinophil #: 0.1 x10 3/mm (ref 0.0–0.7)
Eosinophil %: 2.7 %
HGB: 10.2 g/dL — ABNORMAL LOW (ref 12.0–16.0)
Lymphocyte %: 17.9 %
MCH: 29.4 pg (ref 26.0–34.0)
MCV: 90 fL (ref 80–100)
Monocyte #: 0.5 x10 3/mm (ref 0.2–0.9)
Monocyte %: 12.6 %
Neutrophil #: 2.4 x10 3/mm (ref 1.4–6.5)
Neutrophil %: 66.3 %
Platelet: 152 x10 3/mm (ref 150–440)
RBC: 3.48 10*6/uL — ABNORMAL LOW (ref 3.80–5.20)
RDW: 14.7 % — ABNORMAL HIGH (ref 11.5–14.5)

## 2013-02-22 LAB — COMPREHENSIVE METABOLIC PANEL
Albumin: 3.2 g/dL — ABNORMAL LOW (ref 3.4–5.0)
Anion Gap: 5 — ABNORMAL LOW (ref 7–16)
BUN: 13 mg/dL (ref 7–18)
Bilirubin,Total: 0.2 mg/dL (ref 0.2–1.0)
Calcium, Total: 8.5 mg/dL (ref 8.5–10.1)
Creatinine: 0.87 mg/dL (ref 0.60–1.30)
EGFR (Non-African Amer.): >60
Glucose: 101 mg/dL — ABNORMAL HIGH (ref 65–99)
Potassium: 3.6 mmol/L (ref 3.5–5.1)
Total Protein: 6.9 g/dL (ref 6.4–8.2)

## 2013-03-15 LAB — COMPREHENSIVE METABOLIC PANEL
Alkaline Phosphatase: 114 U/L (ref 50–136)
Bilirubin,Total: 0.3 mg/dL (ref 0.2–1.0)
Calcium, Total: 8.3 mg/dL — ABNORMAL LOW (ref 8.5–10.1)
Chloride: 98 mmol/L (ref 98–107)
EGFR (African American): >60
EGFR (Non-African Amer.): >60
Glucose: 85 mg/dL (ref 65–99)
Osmolality: 268 (ref 275–301)
Potassium: 3.7 mmol/L (ref 3.5–5.1)
Sodium: 135 mmol/L — ABNORMAL LOW (ref 136–145)

## 2013-03-15 LAB — CBC CANCER CENTER
Basophil #: 0 x10 3/mm (ref 0.0–0.1)
Eosinophil %: 2.8 %
MCH: 29.3 pg (ref 26.0–34.0)
MCV: 89 fL (ref 80–100)
Monocyte %: 8.7 %
Neutrophil #: 3.9 x10 3/mm (ref 1.4–6.5)

## 2013-03-24 ENCOUNTER — Ambulatory Visit: Payer: Self-pay | Admitting: Oncology

## 2013-04-05 LAB — COMPREHENSIVE METABOLIC PANEL
Alkaline Phosphatase: 108 U/L (ref 50–136)
Anion Gap: 7 (ref 7–16)
BUN: 8 mg/dL (ref 7–18)
Bilirubin,Total: 0.3 mg/dL (ref 0.2–1.0)
Co2: 29 mmol/L (ref 21–32)
EGFR (African American): >60
Osmolality: 272 (ref 275–301)
SGOT(AST): 26 U/L (ref 15–37)
SGPT (ALT): 26 U/L (ref 12–78)

## 2013-04-05 LAB — CBC CANCER CENTER
Basophil %: 0.3 %
Eosinophil %: 3 %
HCT: 30.8 % — ABNORMAL LOW (ref 35.0–47.0)
HGB: 10 g/dL — ABNORMAL LOW (ref 12.0–16.0)
Lymphocyte #: 0.7 x10 3/mm — ABNORMAL LOW (ref 1.0–3.6)
MCH: 29.2 pg (ref 26.0–34.0)
MCHC: 32.6 g/dL (ref 32.0–36.0)
Monocyte #: 0.4 x10 3/mm (ref 0.2–0.9)
Neutrophil %: 64.5 %
RBC: 3.44 10*6/uL — ABNORMAL LOW (ref 3.80–5.20)

## 2013-04-10 ENCOUNTER — Inpatient Hospital Stay: Payer: Self-pay | Admitting: Orthopedic Surgery

## 2013-04-10 LAB — PROTIME-INR: Prothrombin Time: 13.1 secs (ref 11.5–14.7)

## 2013-04-10 LAB — CBC WITH DIFFERENTIAL/PLATELET
Basophil #: 0 10*3/uL (ref 0.0–0.1)
Basophil %: 0.1 %
Eosinophil #: 0 10*3/uL (ref 0.0–0.7)
HCT: 28 % — ABNORMAL LOW (ref 35.0–47.0)
Lymphocyte %: 8.8 %
MCH: 30.2 pg (ref 26.0–34.0)
Monocyte %: 7.8 %
Neutrophil #: 4.2 10*3/uL (ref 1.4–6.5)
Platelet: 156 10*3/uL (ref 150–440)
RBC: 3.16 10*6/uL — ABNORMAL LOW (ref 3.80–5.20)
RDW: 15 % — ABNORMAL HIGH (ref 11.5–14.5)

## 2013-04-10 LAB — BASIC METABOLIC PANEL
BUN: 11 mg/dL (ref 7–18)
Calcium, Total: 8.3 mg/dL — ABNORMAL LOW (ref 8.5–10.1)
Co2: 30 mmol/L (ref 21–32)
Creatinine: 0.89 mg/dL (ref 0.60–1.30)
EGFR (Non-African Amer.): >60
Sodium: 136 mmol/L (ref 136–145)

## 2013-04-23 ENCOUNTER — Ambulatory Visit: Payer: Self-pay | Admitting: Oncology

## 2013-05-03 LAB — COMPREHENSIVE METABOLIC PANEL
Calcium, Total: 8.4 mg/dL — ABNORMAL LOW (ref 8.5–10.1)
EGFR (Non-African Amer.): >60
Glucose: 96 mg/dL (ref 65–99)
Osmolality: 268 (ref 275–301)
SGPT (ALT): 22 U/L (ref 12–78)
Sodium: 135 mmol/L — ABNORMAL LOW (ref 136–145)

## 2013-05-03 LAB — URINALYSIS, COMPLETE
Glucose,UR: NEGATIVE mg/dL (ref 0–75)
Ketone: NEGATIVE
Leukocyte Esterase: NEGATIVE
Nitrite: NEGATIVE
Protein: NEGATIVE
Specific Gravity: 1.01 (ref 1.003–1.030)

## 2013-05-03 LAB — CBC CANCER CENTER
Basophil #: 0 x10 3/mm (ref 0.0–0.1)
Basophil %: 0.2 %
Eosinophil #: 0.1 x10 3/mm (ref 0.0–0.7)
HCT: 28.7 % — ABNORMAL LOW (ref 35.0–47.0)
HGB: 9.7 g/dL — ABNORMAL LOW (ref 12.0–16.0)
Lymphocyte #: 0.6 x10 3/mm — ABNORMAL LOW (ref 1.0–3.6)
MCH: 30 pg (ref 26.0–34.0)
MCHC: 33.8 g/dL (ref 32.0–36.0)
MCV: 89 fL (ref 80–100)
Monocyte %: 12.3 %
Neutrophil #: 2.2 x10 3/mm (ref 1.4–6.5)
Platelet: 195 x10 3/mm (ref 150–440)
RDW: 16.1 % — ABNORMAL HIGH (ref 11.5–14.5)

## 2013-05-22 LAB — CBC CANCER CENTER
Basophil %: 0.1 %
HGB: 9.2 g/dL — ABNORMAL LOW (ref 12.0–16.0)
Lymphocyte #: 0.3 x10 3/mm — ABNORMAL LOW (ref 1.0–3.6)
MCH: 30.5 pg (ref 26.0–34.0)
MCHC: 34 g/dL (ref 32.0–36.0)
Monocyte %: 11.1 %
Neutrophil %: 84.4 %
RBC: 3.01 10*6/uL — ABNORMAL LOW (ref 3.80–5.20)
RDW: 15.6 % — ABNORMAL HIGH (ref 11.5–14.5)
WBC: 6.4 x10 3/mm (ref 3.6–11.0)

## 2013-05-22 LAB — COMPREHENSIVE METABOLIC PANEL
Albumin: 2.8 g/dL — ABNORMAL LOW (ref 3.4–5.0)
Anion Gap: 8 (ref 7–16)
Bilirubin,Total: 0.5 mg/dL (ref 0.2–1.0)
Calcium, Total: 8.3 mg/dL — ABNORMAL LOW (ref 8.5–10.1)
Chloride: 95 mmol/L — ABNORMAL LOW (ref 98–107)
Creatinine: 0.81 mg/dL (ref 0.60–1.30)
EGFR (African American): >60
Glucose: 95 mg/dL (ref 65–99)
Osmolality: 262 (ref 275–301)
SGPT (ALT): 31 U/L (ref 12–78)
Total Protein: 6.5 g/dL (ref 6.4–8.2)

## 2013-05-22 LAB — URINALYSIS, COMPLETE
Bacteria: NONE SEEN
Blood: NEGATIVE
Glucose,UR: NEGATIVE mg/dL (ref 0–75)
Leukocyte Esterase: NEGATIVE
Ph: 5 (ref 4.5–8.0)
Protein: 30
RBC,UR: 4 /HPF (ref 0–5)
Specific Gravity: 1.021 (ref 1.003–1.030)

## 2013-05-23 LAB — URINE CULTURE

## 2013-05-24 ENCOUNTER — Ambulatory Visit: Payer: Self-pay | Admitting: Oncology

## 2013-05-27 LAB — CULTURE, BLOOD (SINGLE)

## 2013-06-21 LAB — CBC CANCER CENTER
Basophil #: 0 x10 3/mm (ref 0.0–0.1)
Basophil %: 0.4 %
Eosinophil #: 0.2 x10 3/mm (ref 0.0–0.7)
Eosinophil %: 4.3 %
HCT: 27.7 % — ABNORMAL LOW (ref 35.0–47.0)
HGB: 9 g/dL — ABNORMAL LOW (ref 12.0–16.0)
Lymphocyte #: 0.7 x10 3/mm — ABNORMAL LOW (ref 1.0–3.6)
Lymphocyte %: 13 %
MCH: 27.6 pg (ref 26.0–34.0)
MCHC: 32.6 g/dL (ref 32.0–36.0)
MCV: 85 fL (ref 80–100)
Monocyte #: 0.5 x10 3/mm (ref 0.2–0.9)
Monocyte %: 9.8 %
Neutrophil #: 3.6 x10 3/mm (ref 1.4–6.5)
Neutrophil %: 72.5 %
Platelet: 280 x10 3/mm (ref 150–440)
RBC: 3.26 10*6/uL — ABNORMAL LOW (ref 3.80–5.20)
RDW: 15.7 % — ABNORMAL HIGH (ref 11.5–14.5)

## 2013-06-21 LAB — COMPREHENSIVE METABOLIC PANEL
Albumin: 2.7 g/dL — ABNORMAL LOW (ref 3.4–5.0)
Alkaline Phosphatase: 107 U/L (ref 50–136)
Calcium, Total: 8.3 mg/dL — ABNORMAL LOW (ref 8.5–10.1)
Co2: 29 mmol/L (ref 21–32)
Creatinine: 0.86 mg/dL (ref 0.60–1.30)
EGFR (Non-African Amer.): >60
Glucose: 105 mg/dL — ABNORMAL HIGH (ref 65–99)
Osmolality: 273 (ref 275–301)
SGOT(AST): 24 U/L (ref 15–37)
SGPT (ALT): 16 U/L (ref 12–78)
Sodium: 137 mmol/L (ref 136–145)

## 2013-06-21 LAB — URINALYSIS, COMPLETE
Ketone: NEGATIVE
Specific Gravity: 1.01 (ref 1.003–1.030)

## 2013-06-24 ENCOUNTER — Ambulatory Visit: Payer: Self-pay | Admitting: Oncology

## 2013-07-12 LAB — COMPREHENSIVE METABOLIC PANEL
Albumin: 3.1 g/dL — ABNORMAL LOW (ref 3.4–5.0)
Anion Gap: 8 (ref 7–16)
Bilirubin,Total: 0.2 mg/dL (ref 0.2–1.0)
Calcium, Total: 8.3 mg/dL — ABNORMAL LOW (ref 8.5–10.1)
Chloride: 100 mmol/L (ref 98–107)
Co2: 29 mmol/L (ref 21–32)
Creatinine: 0.91 mg/dL (ref 0.60–1.30)
EGFR (African American): >60
Osmolality: 275 (ref 275–301)
Potassium: 3.7 mmol/L (ref 3.5–5.1)
SGOT(AST): 24 U/L (ref 15–37)
Total Protein: 7.3 g/dL (ref 6.4–8.2)

## 2013-07-12 LAB — CBC CANCER CENTER
Basophil %: 0.4 %
Eosinophil #: 0.3 x10 3/mm (ref 0.0–0.7)
Eosinophil %: 8.9 %
Lymphocyte #: 0.7 x10 3/mm — ABNORMAL LOW (ref 1.0–3.6)
Lymphocyte %: 20.8 %
MCH: 26.7 pg (ref 26.0–34.0)
MCHC: 32.3 g/dL (ref 32.0–36.0)
MCV: 83 fL (ref 80–100)
Monocyte #: 0.3 x10 3/mm (ref 0.2–0.9)
Neutrophil #: 1.9 x10 3/mm (ref 1.4–6.5)
Neutrophil %: 60.3 %
Platelet: 159 x10 3/mm (ref 150–440)
RBC: 3.44 10*6/uL — ABNORMAL LOW (ref 3.80–5.20)
WBC: 3.1 x10 3/mm — ABNORMAL LOW (ref 3.6–11.0)

## 2013-07-24 ENCOUNTER — Ambulatory Visit: Payer: Self-pay | Admitting: Oncology

## 2013-07-24 LAB — COMPREHENSIVE METABOLIC PANEL
Albumin: 3.4 g/dL (ref 3.4–5.0)
Bilirubin,Total: 0.2 mg/dL (ref 0.2–1.0)
Calcium, Total: 8.5 mg/dL (ref 8.5–10.1)
Chloride: 99 mmol/L (ref 98–107)
EGFR (African American): >60
Osmolality: 270 (ref 275–301)
SGPT (ALT): 16 U/L (ref 12–78)

## 2013-07-24 LAB — CBC CANCER CENTER
Eosinophil %: 2.9 %
HGB: 9.2 g/dL — ABNORMAL LOW (ref 12.0–16.0)
Lymphocyte #: 0.5 x10 3/mm — ABNORMAL LOW (ref 1.0–3.6)
MCV: 82 fL (ref 80–100)
Monocyte %: 9 %
Neutrophil %: 68.8 %
Platelet: 186 x10 3/mm (ref 150–440)
RBC: 3.53 10*6/uL — ABNORMAL LOW (ref 3.80–5.20)
RDW: 17.3 % — ABNORMAL HIGH (ref 11.5–14.5)
WBC: 2.9 x10 3/mm — ABNORMAL LOW (ref 3.6–11.0)

## 2013-08-02 LAB — CBC CANCER CENTER
Basophil #: 0 x10 3/mm (ref 0.0–0.1)
Basophil %: 0.4 %
Eosinophil #: 0.1 x10 3/mm (ref 0.0–0.7)
Eosinophil %: 3.6 %
HCT: 28.8 % — ABNORMAL LOW (ref 35.0–47.0)
Lymphocyte #: 0.6 x10 3/mm — ABNORMAL LOW (ref 1.0–3.6)
Lymphocyte %: 16.3 %
MCH: 26.4 pg (ref 26.0–34.0)
MCV: 83 fL (ref 80–100)
Monocyte #: 0.4 x10 3/mm (ref 0.2–0.9)
Monocyte %: 10.6 %
Neutrophil %: 69.1 %
Platelet: 154 x10 3/mm (ref 150–440)
RDW: 18.2 % — ABNORMAL HIGH (ref 11.5–14.5)

## 2013-08-02 LAB — COMPREHENSIVE METABOLIC PANEL
Albumin: 3 g/dL — ABNORMAL LOW (ref 3.4–5.0)
Alkaline Phosphatase: 113 U/L (ref 50–136)
Anion Gap: 5 — ABNORMAL LOW (ref 7–16)
Bilirubin,Total: 0.3 mg/dL (ref 0.2–1.0)
Calcium, Total: 8.1 mg/dL — ABNORMAL LOW (ref 8.5–10.1)
SGOT(AST): 18 U/L (ref 15–37)
SGPT (ALT): 20 U/L (ref 12–78)

## 2013-08-23 LAB — COMPREHENSIVE METABOLIC PANEL
Albumin: 3 g/dL — ABNORMAL LOW (ref 3.4–5.0)
Alkaline Phosphatase: 123 U/L (ref 50–136)
Bilirubin,Total: 0.2 mg/dL (ref 0.2–1.0)
Chloride: 101 mmol/L (ref 98–107)
Co2: 30 mmol/L (ref 21–32)
Creatinine: 0.81 mg/dL (ref 0.60–1.30)
Osmolality: 278 (ref 275–301)
Potassium: 3.6 mmol/L (ref 3.5–5.1)
SGPT (ALT): 20 U/L (ref 12–78)

## 2013-08-23 LAB — CBC CANCER CENTER
Basophil #: 0 x10 3/mm (ref 0.0–0.1)
Basophil %: 0.4 %
Eosinophil #: 0.1 x10 3/mm (ref 0.0–0.7)
Eosinophil %: 4.6 %
Lymphocyte #: 0.7 x10 3/mm — ABNORMAL LOW (ref 1.0–3.6)
Lymphocyte %: 25.7 %
MCH: 26.8 pg (ref 26.0–34.0)
MCHC: 31.8 g/dL — ABNORMAL LOW (ref 32.0–36.0)
MCV: 84 fL (ref 80–100)
Monocyte %: 11 %
Neutrophil #: 1.6 x10 3/mm (ref 1.4–6.5)
Neutrophil %: 58.3 %
RBC: 3.36 10*6/uL — ABNORMAL LOW (ref 3.80–5.20)
RDW: 19.7 % — ABNORMAL HIGH (ref 11.5–14.5)
WBC: 2.7 x10 3/mm — ABNORMAL LOW (ref 3.6–11.0)

## 2013-08-24 ENCOUNTER — Ambulatory Visit: Payer: Self-pay | Admitting: Oncology

## 2013-09-27 ENCOUNTER — Ambulatory Visit: Payer: Self-pay | Admitting: Oncology

## 2013-09-27 LAB — CBC CANCER CENTER
Basophil #: 0 x10 3/mm (ref 0.0–0.1)
Eosinophil #: 0.1 x10 3/mm (ref 0.0–0.7)
Eosinophil %: 3 %
HGB: 10.2 g/dL — ABNORMAL LOW (ref 12.0–16.0)
Lymphocyte #: 0.7 x10 3/mm — ABNORMAL LOW (ref 1.0–3.6)
MCH: 26.7 pg (ref 26.0–34.0)
MCHC: 32 g/dL (ref 32.0–36.0)
Neutrophil #: 1.7 x10 3/mm (ref 1.4–6.5)
Neutrophil %: 59.6 %
Platelet: 148 x10 3/mm — ABNORMAL LOW (ref 150–440)
RBC: 3.82 10*6/uL (ref 3.80–5.20)
WBC: 2.9 x10 3/mm — ABNORMAL LOW (ref 3.6–11.0)

## 2013-09-27 LAB — COMPREHENSIVE METABOLIC PANEL
BUN: 14 mg/dL (ref 7–18)
EGFR (African American): >60
EGFR (Non-African Amer.): 59 — ABNORMAL LOW
Glucose: 110 mg/dL — ABNORMAL HIGH (ref 65–99)
Osmolality: 266 (ref 275–301)
Potassium: 3.8 mmol/L (ref 3.5–5.1)
SGOT(AST): 26 U/L (ref 15–37)
SGPT (ALT): 20 U/L (ref 12–78)
Sodium: 132 mmol/L — ABNORMAL LOW (ref 136–145)

## 2013-10-19 IMAGING — CT CT CHEST-ABD W/ CM
1 of 2 series · 14 of 32 positions shown, 19 images · non-contrast
Comparison: 09/03/2012

REASON FOR EXAM: restaging gastric cancer  high risk meds
COMMENTS:

PROCEDURE:     KCT - KCT CHEST AND ABDOMEN W CONTRAST  - May 16, 2013 [DATE]
RESULT:     CT CHEST AND ABDOMEN
HISTORY: Gastric cancer
TECHNIQUE: Multiple axial images obtained from the thoracic inlet to the
iliac crests, with p.o. contrast and with 85 ml of Csovue-6BB intravenous
contrast.

[Series 2: ch-ab-pel w 3.0 i40f 3 · axial · 0.61mm/px · z∈[-590,-233]mm · 14 of 135 slices shown, 19 images]
[im 8/135  soft-tissue]
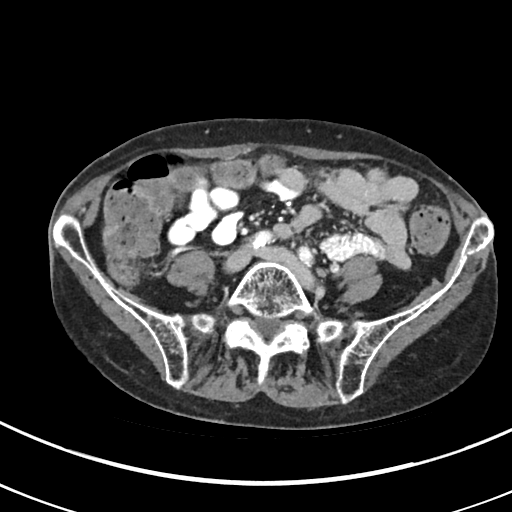
[im 8/135  bone]
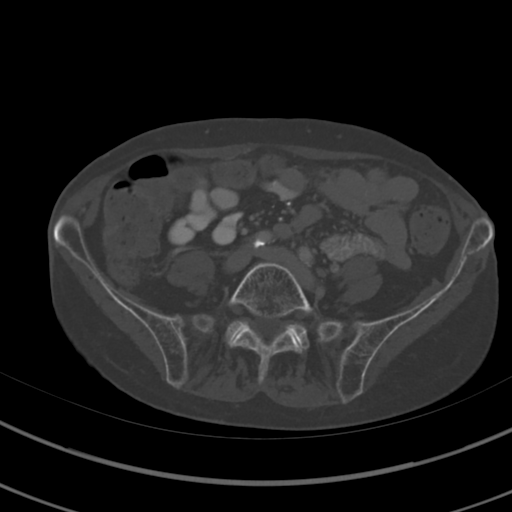
[im 15/135  soft-tissue]
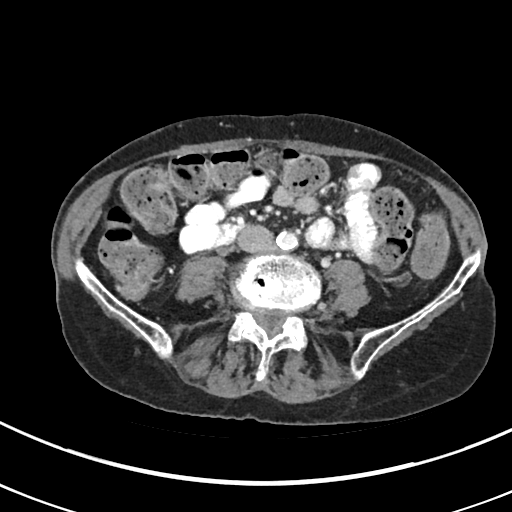
[im 30/135  soft-tissue]
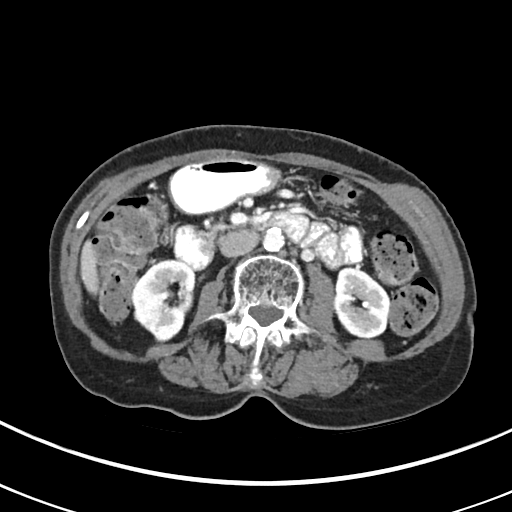
[im 38/135  soft-tissue]
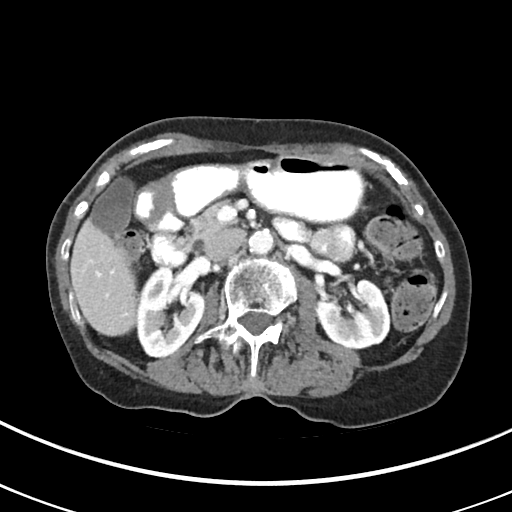
[im 45/135  soft-tissue]
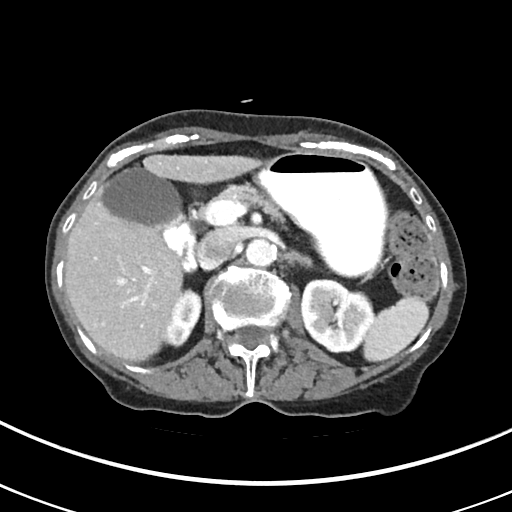
[im 60/135  soft-tissue]
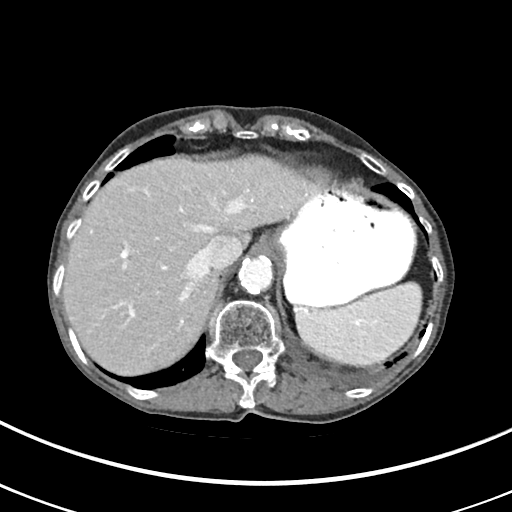
[im 68/135  soft-tissue]
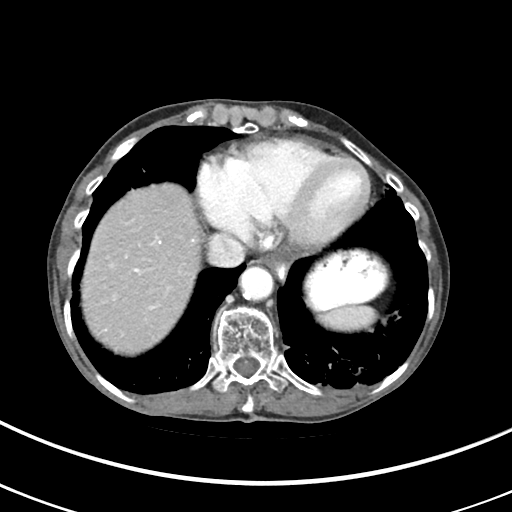
[im 75/135  soft-tissue]
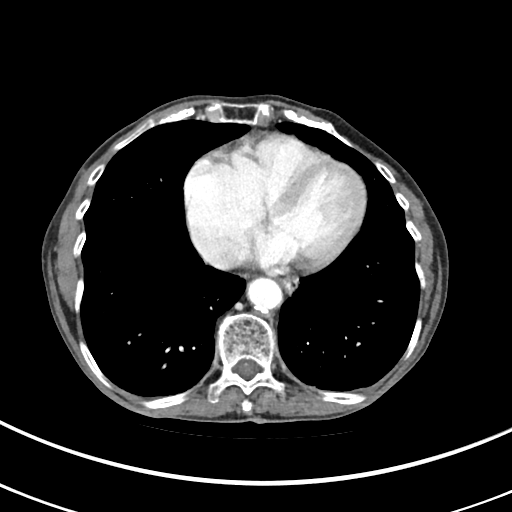
[im 90/135  soft-tissue]
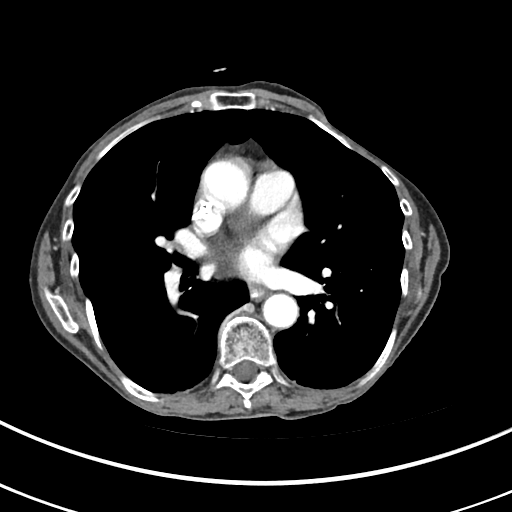
[im 90/135  bone]
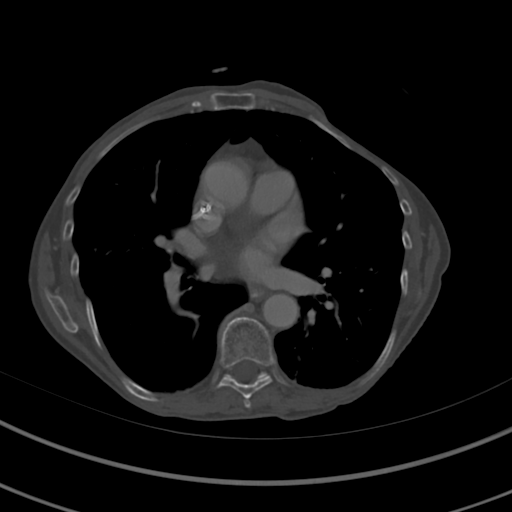
[im 97/135  soft-tissue]
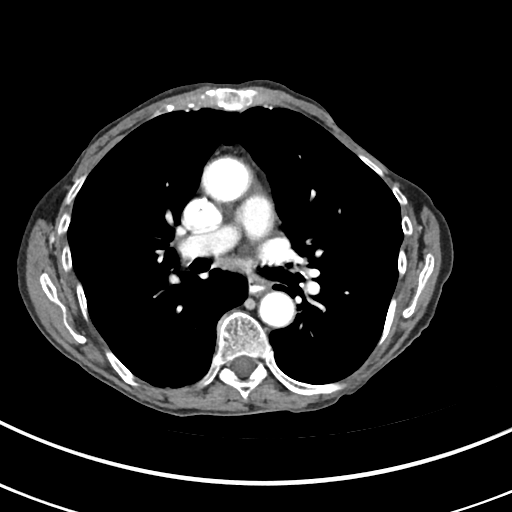
[im 105/135  soft-tissue]
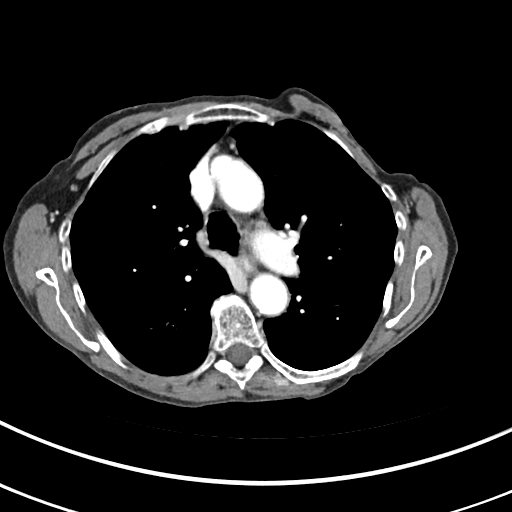
[im 105/135  lung]
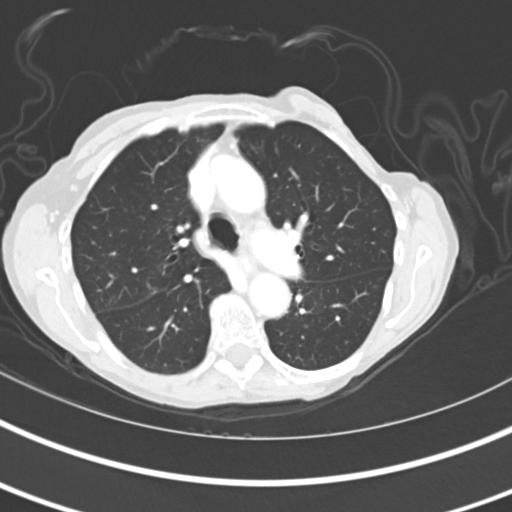
[im 112/135  lung]
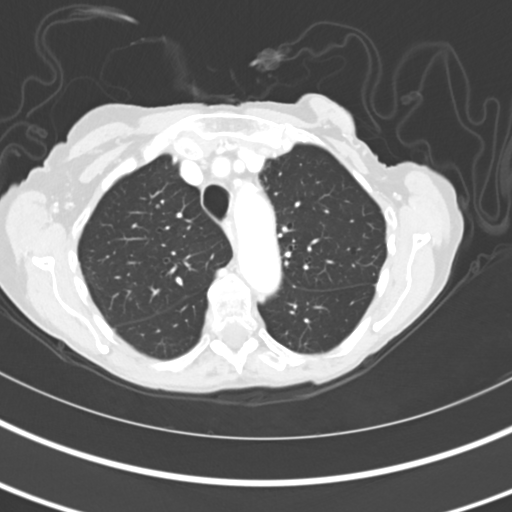
[im 120/135  soft-tissue]
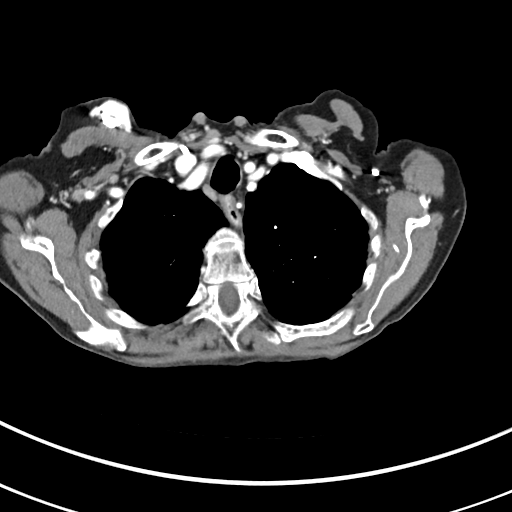
[im 120/135  lung]
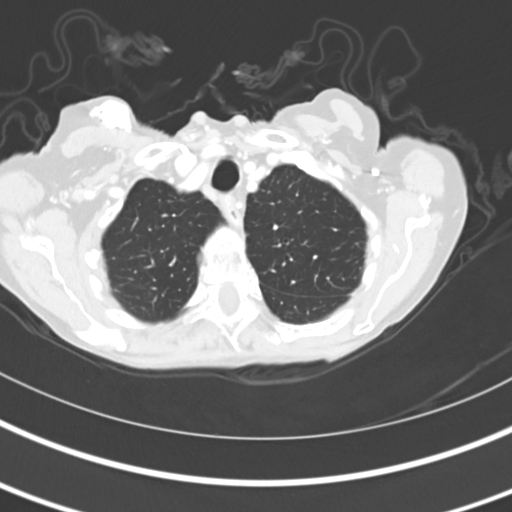
[im 127/135  soft-tissue]
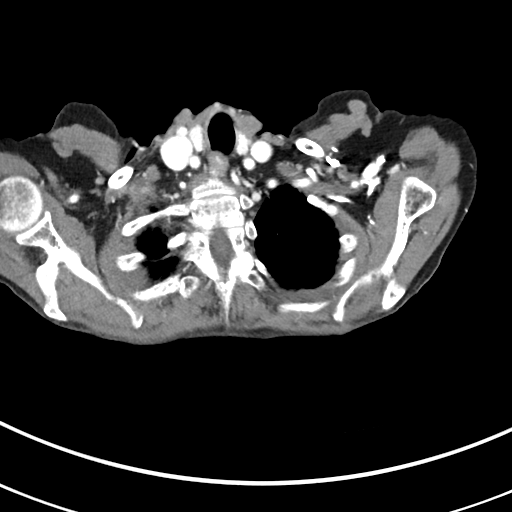
[im 127/135  lung]
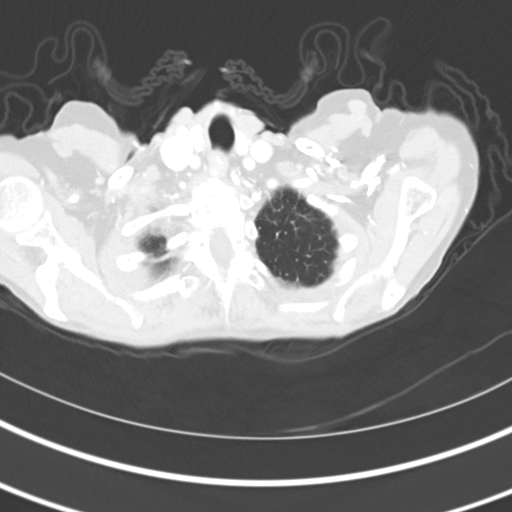

[14 of 32 positions shown; findings below may reference images not displayed]

FINDINGS: CHEST:

The lungs are clear. There is no focal mass. There is no focal parenchymal
opacity, pleural effusion, or pneumothorax.

The heart size is normal. There is no pericardial effusion. There is a right
posterior lateral fluid attenuating mass abutting the pericardium likely
representing a pericardial cyst or bronchogenic cyst.

There are no pathologically enlarged mediastinal, hilar, or axillary lymph
nodes.

The osseous structures demonstrate no focal abnormality.

ABDOMEN:

The liver demonstrates no focal abnormality. There is no intrahepatic or
extrahepatic biliary ductal dilatation. The gallbladder is unremarkable. The
spleen demonstrates no focal abnormality. The kidneys, adrenal glands,
pancreas are normal.

The visualized portions stomach, duodenum, small intestine, and large
intestine demonstrate no contrast extravasation or dilatation. There is no
pneumoperitoneum, pneumatosis, or portal venous gas. There is no abdominal
free fluid. There is no lymphadenopathy.

The abdominal aorta is normal in caliber with atherosclerosis.

The osseous structures are unremarkable.
IMPRESSION: 1. No evidence of recurrent or residual malignancy involving the chest or
abdomen.

[REDACTED]

## 2013-10-24 ENCOUNTER — Ambulatory Visit: Payer: Self-pay | Admitting: Oncology

## 2013-11-07 ENCOUNTER — Ambulatory Visit: Payer: Self-pay | Admitting: Unknown Physician Specialty

## 2013-11-15 ENCOUNTER — Ambulatory Visit: Payer: Self-pay | Admitting: Oncology

## 2013-11-15 LAB — CBC CANCER CENTER
Basophil #: 0 x10 3/mm (ref 0.0–0.1)
Basophil %: 0.2 %
EOS PCT: 1.1 %
Eosinophil #: 0 x10 3/mm (ref 0.0–0.7)
HCT: 29.6 % — ABNORMAL LOW (ref 35.0–47.0)
HGB: 9.7 g/dL — ABNORMAL LOW (ref 12.0–16.0)
LYMPHS ABS: 0.6 x10 3/mm — AB (ref 1.0–3.6)
LYMPHS PCT: 16.6 %
MCH: 27.8 pg (ref 26.0–34.0)
MCHC: 32.9 g/dL (ref 32.0–36.0)
MCV: 85 fL (ref 80–100)
MONO ABS: 0.4 x10 3/mm (ref 0.2–0.9)
Monocyte %: 10.8 %
Neutrophil #: 2.5 x10 3/mm (ref 1.4–6.5)
Neutrophil %: 71.3 %
PLATELETS: 194 x10 3/mm (ref 150–440)
RBC: 3.5 10*6/uL — ABNORMAL LOW (ref 3.80–5.20)
RDW: 17.2 % — ABNORMAL HIGH (ref 11.5–14.5)
WBC: 3.6 x10 3/mm (ref 3.6–11.0)

## 2013-11-15 LAB — COMPREHENSIVE METABOLIC PANEL
ALBUMIN: 3.2 g/dL — AB (ref 3.4–5.0)
ALT: 18 U/L (ref 12–78)
ANION GAP: 8 (ref 7–16)
Alkaline Phosphatase: 133 U/L — ABNORMAL HIGH
BUN: 8 mg/dL (ref 7–18)
Bilirubin,Total: 0.3 mg/dL (ref 0.2–1.0)
CHLORIDE: 100 mmol/L (ref 98–107)
CREATININE: 0.79 mg/dL (ref 0.60–1.30)
Calcium, Total: 8.2 mg/dL — ABNORMAL LOW (ref 8.5–10.1)
Co2: 30 mmol/L (ref 21–32)
EGFR (Non-African Amer.): >60
Glucose: 105 mg/dL — ABNORMAL HIGH (ref 65–99)
Osmolality: 274 (ref 275–301)
Potassium: 2.9 mmol/L — ABNORMAL LOW (ref 3.5–5.1)
SGOT(AST): 20 U/L (ref 15–37)
Sodium: 138 mmol/L (ref 136–145)
Total Protein: 6.9 g/dL (ref 6.4–8.2)

## 2013-11-24 ENCOUNTER — Ambulatory Visit: Payer: Self-pay | Admitting: Oncology

## 2013-11-28 ENCOUNTER — Ambulatory Visit: Payer: Self-pay | Admitting: Orthopedic Surgery

## 2013-12-04 ENCOUNTER — Ambulatory Visit: Payer: Self-pay | Admitting: Oncology

## 2013-12-16 ENCOUNTER — Ambulatory Visit: Payer: Self-pay | Admitting: Orthopedic Surgery

## 2013-12-20 ENCOUNTER — Ambulatory Visit: Payer: Self-pay | Admitting: Orthopedic Surgery

## 2013-12-22 ENCOUNTER — Ambulatory Visit: Payer: Self-pay | Admitting: Oncology

## 2013-12-23 LAB — PATHOLOGY REPORT

## 2014-01-23 ENCOUNTER — Ambulatory Visit: Payer: Self-pay | Admitting: Oncology

## 2014-01-24 LAB — CBC CANCER CENTER
Basophil #: 0 x10 3/mm (ref 0.0–0.1)
Basophil %: 0.4 %
Eosinophil #: 0.1 x10 3/mm (ref 0.0–0.7)
Eosinophil %: 3.1 %
HCT: 30.5 % — AB (ref 35.0–47.0)
HGB: 10 g/dL — ABNORMAL LOW (ref 12.0–16.0)
Lymphocyte #: 0.7 x10 3/mm — ABNORMAL LOW (ref 1.0–3.6)
Lymphocyte %: 20.5 %
MCH: 29.2 pg (ref 26.0–34.0)
MCHC: 32.9 g/dL (ref 32.0–36.0)
MCV: 89 fL (ref 80–100)
MONOS PCT: 10.3 %
Monocyte #: 0.3 x10 3/mm (ref 0.2–0.9)
NEUTROS ABS: 2.2 x10 3/mm (ref 1.4–6.5)
NEUTROS PCT: 65.7 %
Platelet: 163 x10 3/mm (ref 150–440)
RBC: 3.42 10*6/uL — ABNORMAL LOW (ref 3.80–5.20)
RDW: 17.1 % — ABNORMAL HIGH (ref 11.5–14.5)
WBC: 3.4 x10 3/mm — AB (ref 3.6–11.0)

## 2014-01-24 LAB — COMPREHENSIVE METABOLIC PANEL
ALBUMIN: 3 g/dL — AB (ref 3.4–5.0)
ANION GAP: 5 — AB (ref 7–16)
AST: 22 U/L (ref 15–37)
Alkaline Phosphatase: 128 U/L — ABNORMAL HIGH
BILIRUBIN TOTAL: 0.2 mg/dL (ref 0.2–1.0)
BUN: 7 mg/dL (ref 7–18)
CHLORIDE: 104 mmol/L (ref 98–107)
Calcium, Total: 8.2 mg/dL — ABNORMAL LOW (ref 8.5–10.1)
Co2: 32 mmol/L (ref 21–32)
Creatinine: 0.76 mg/dL (ref 0.60–1.30)
EGFR (Non-African Amer.): >60
Glucose: 110 mg/dL — ABNORMAL HIGH (ref 65–99)
OSMOLALITY: 280 (ref 275–301)
POTASSIUM: 3.1 mmol/L — AB (ref 3.5–5.1)
SGPT (ALT): 18 U/L (ref 12–78)
Sodium: 141 mmol/L (ref 136–145)
Total Protein: 6.6 g/dL (ref 6.4–8.2)

## 2014-02-21 ENCOUNTER — Ambulatory Visit: Payer: Self-pay | Admitting: Oncology

## 2014-02-28 LAB — CBC CANCER CENTER
BASOS ABS: 0 x10 3/mm (ref 0.0–0.1)
BASOS PCT: 0.1 %
Eosinophil #: 0 x10 3/mm (ref 0.0–0.7)
Eosinophil %: 0.4 %
HCT: 30.1 % — AB (ref 35.0–47.0)
HGB: 10.1 g/dL — ABNORMAL LOW (ref 12.0–16.0)
LYMPHS ABS: 0.5 x10 3/mm — AB (ref 1.0–3.6)
Lymphocyte %: 8.4 %
MCH: 30.4 pg (ref 26.0–34.0)
MCHC: 33.4 g/dL (ref 32.0–36.0)
MCV: 91 fL (ref 80–100)
MONOS PCT: 10.7 %
Monocyte #: 0.7 x10 3/mm (ref 0.2–0.9)
Neutrophil #: 5.2 x10 3/mm (ref 1.4–6.5)
Neutrophil %: 80.4 %
PLATELETS: 157 x10 3/mm (ref 150–440)
RBC: 3.32 10*6/uL — ABNORMAL LOW (ref 3.80–5.20)
RDW: 15.2 % — ABNORMAL HIGH (ref 11.5–14.5)
WBC: 6.4 x10 3/mm (ref 3.6–11.0)

## 2014-02-28 LAB — COMPREHENSIVE METABOLIC PANEL
Albumin: 2.7 g/dL — ABNORMAL LOW (ref 3.4–5.0)
Alkaline Phosphatase: 107 U/L
Anion Gap: 8 (ref 7–16)
BILIRUBIN TOTAL: 0.4 mg/dL (ref 0.2–1.0)
BUN: 9 mg/dL (ref 7–18)
CALCIUM: 8.1 mg/dL — AB (ref 8.5–10.1)
CHLORIDE: 97 mmol/L — AB (ref 98–107)
CO2: 31 mmol/L (ref 21–32)
CREATININE: 0.72 mg/dL (ref 0.60–1.30)
EGFR (African American): >60
Glucose: 122 mg/dL — ABNORMAL HIGH (ref 65–99)
Osmolality: 272 (ref 275–301)
POTASSIUM: 2.8 mmol/L — AB (ref 3.5–5.1)
SGOT(AST): 17 U/L (ref 15–37)
SGPT (ALT): 16 U/L (ref 12–78)
SODIUM: 136 mmol/L (ref 136–145)
Total Protein: 6.6 g/dL (ref 6.4–8.2)

## 2014-03-07 LAB — CBC CANCER CENTER
BASOS PCT: 0.4 %
Basophil #: 0 x10 3/mm (ref 0.0–0.1)
Eosinophil #: 0.1 x10 3/mm (ref 0.0–0.7)
Eosinophil %: 2 %
HCT: 31.3 % — ABNORMAL LOW (ref 35.0–47.0)
HGB: 10.6 g/dL — AB (ref 12.0–16.0)
LYMPHS PCT: 13.7 %
Lymphocyte #: 0.7 x10 3/mm — ABNORMAL LOW (ref 1.0–3.6)
MCH: 30.2 pg (ref 26.0–34.0)
MCHC: 33.7 g/dL (ref 32.0–36.0)
MCV: 90 fL (ref 80–100)
MONOS PCT: 8 %
Monocyte #: 0.4 x10 3/mm (ref 0.2–0.9)
Neutrophil #: 3.8 x10 3/mm (ref 1.4–6.5)
Neutrophil %: 75.9 %
Platelet: 250 x10 3/mm (ref 150–440)
RBC: 3.5 10*6/uL — ABNORMAL LOW (ref 3.80–5.20)
RDW: 15 % — ABNORMAL HIGH (ref 11.5–14.5)
WBC: 5 x10 3/mm (ref 3.6–11.0)

## 2014-03-07 LAB — COMPREHENSIVE METABOLIC PANEL
ALBUMIN: 2.8 g/dL — AB (ref 3.4–5.0)
ALK PHOS: 114 U/L
Anion Gap: 8 (ref 7–16)
BUN: 7 mg/dL (ref 7–18)
Bilirubin,Total: 0.2 mg/dL (ref 0.2–1.0)
CALCIUM: 8.4 mg/dL — AB (ref 8.5–10.1)
CO2: 28 mmol/L (ref 21–32)
CREATININE: 0.77 mg/dL (ref 0.60–1.30)
Chloride: 100 mmol/L (ref 98–107)
Glucose: 114 mg/dL — ABNORMAL HIGH (ref 65–99)
Osmolality: 271 (ref 275–301)
POTASSIUM: 3.5 mmol/L (ref 3.5–5.1)
SGOT(AST): 21 U/L (ref 15–37)
SGPT (ALT): 18 U/L (ref 12–78)
SODIUM: 136 mmol/L (ref 136–145)
TOTAL PROTEIN: 6.9 g/dL (ref 6.4–8.2)

## 2014-03-24 ENCOUNTER — Ambulatory Visit: Payer: Self-pay | Admitting: Oncology

## 2014-04-04 LAB — CBC CANCER CENTER
Basophil #: 0 x10 3/mm (ref 0.0–0.1)
Basophil %: 0.3 %
Eosinophil #: 0.2 x10 3/mm (ref 0.0–0.7)
Eosinophil %: 5.4 %
HCT: 28.6 % — AB (ref 35.0–47.0)
HGB: 9.4 g/dL — AB (ref 12.0–16.0)
LYMPHS ABS: 0.5 x10 3/mm — AB (ref 1.0–3.6)
Lymphocyte %: 14.9 %
MCH: 30 pg (ref 26.0–34.0)
MCHC: 33 g/dL (ref 32.0–36.0)
MCV: 91 fL (ref 80–100)
Monocyte #: 0.3 x10 3/mm (ref 0.2–0.9)
Monocyte %: 10 %
NEUTROS ABS: 2.4 x10 3/mm (ref 1.4–6.5)
Neutrophil %: 69.4 %
Platelet: 152 x10 3/mm (ref 150–440)
RBC: 3.15 10*6/uL — ABNORMAL LOW (ref 3.80–5.20)
RDW: 15.7 % — ABNORMAL HIGH (ref 11.5–14.5)
WBC: 3.5 x10 3/mm — AB (ref 3.6–11.0)

## 2014-04-04 LAB — COMPREHENSIVE METABOLIC PANEL
ALBUMIN: 2.9 g/dL — AB (ref 3.4–5.0)
ALK PHOS: 114 U/L
ANION GAP: 7 (ref 7–16)
BILIRUBIN TOTAL: 0.2 mg/dL (ref 0.2–1.0)
BUN: 10 mg/dL (ref 7–18)
CALCIUM: 8.1 mg/dL — AB (ref 8.5–10.1)
CHLORIDE: 99 mmol/L (ref 98–107)
CO2: 27 mmol/L (ref 21–32)
CREATININE: 0.8 mg/dL (ref 0.60–1.30)
Glucose: 109 mg/dL — ABNORMAL HIGH (ref 65–99)
OSMOLALITY: 266 (ref 275–301)
POTASSIUM: 3.9 mmol/L (ref 3.5–5.1)
SGOT(AST): 20 U/L (ref 15–37)
SGPT (ALT): 17 U/L (ref 12–78)
Sodium: 133 mmol/L — ABNORMAL LOW (ref 136–145)
Total Protein: 6.4 g/dL (ref 6.4–8.2)

## 2014-04-14 DIAGNOSIS — F419 Anxiety disorder, unspecified: Secondary | ICD-10-CM | POA: Insufficient documentation

## 2014-04-18 LAB — COMPREHENSIVE METABOLIC PANEL
ALT: 15 U/L (ref 12–78)
Albumin: 3.1 g/dL — ABNORMAL LOW (ref 3.4–5.0)
Alkaline Phosphatase: 108 U/L
Anion Gap: 6 — ABNORMAL LOW (ref 7–16)
BILIRUBIN TOTAL: 0.2 mg/dL (ref 0.2–1.0)
BUN: 7 mg/dL (ref 7–18)
CALCIUM: 8.2 mg/dL — AB (ref 8.5–10.1)
CHLORIDE: 103 mmol/L (ref 98–107)
CREATININE: 0.76 mg/dL (ref 0.60–1.30)
Co2: 31 mmol/L (ref 21–32)
EGFR (Non-African Amer.): >60
Glucose: 109 mg/dL — ABNORMAL HIGH (ref 65–99)
Osmolality: 278 (ref 275–301)
POTASSIUM: 3.7 mmol/L (ref 3.5–5.1)
SGOT(AST): 20 U/L (ref 15–37)
Sodium: 140 mmol/L (ref 136–145)
TOTAL PROTEIN: 6.7 g/dL (ref 6.4–8.2)

## 2014-04-18 LAB — CBC CANCER CENTER
BASOS PCT: 0.2 %
Basophil #: 0 x10 3/mm (ref 0.0–0.1)
EOS ABS: 0.1 x10 3/mm (ref 0.0–0.7)
Eosinophil %: 3.2 %
HCT: 30.7 % — ABNORMAL LOW (ref 35.0–47.0)
HGB: 9.9 g/dL — ABNORMAL LOW (ref 12.0–16.0)
Lymphocyte #: 0.7 x10 3/mm — ABNORMAL LOW (ref 1.0–3.6)
Lymphocyte %: 21.5 %
MCH: 29.7 pg (ref 26.0–34.0)
MCHC: 32.2 g/dL (ref 32.0–36.0)
MCV: 92 fL (ref 80–100)
MONO ABS: 0.4 x10 3/mm (ref 0.2–0.9)
Monocyte %: 11.3 %
NEUTROS PCT: 63.8 %
Neutrophil #: 2 x10 3/mm (ref 1.4–6.5)
PLATELETS: 144 x10 3/mm — AB (ref 150–440)
RBC: 3.33 10*6/uL — AB (ref 3.80–5.20)
RDW: 15.4 % — ABNORMAL HIGH (ref 11.5–14.5)
WBC: 3.2 x10 3/mm — AB (ref 3.6–11.0)

## 2014-04-23 ENCOUNTER — Ambulatory Visit: Payer: Self-pay | Admitting: Oncology

## 2014-05-16 LAB — COMPREHENSIVE METABOLIC PANEL
ALBUMIN: 3.2 g/dL — AB (ref 3.4–5.0)
AST: 18 U/L (ref 15–37)
Alkaline Phosphatase: 120 U/L — ABNORMAL HIGH
Anion Gap: 5 — ABNORMAL LOW (ref 7–16)
BUN: 9 mg/dL (ref 7–18)
Bilirubin,Total: 0.2 mg/dL (ref 0.2–1.0)
Calcium, Total: 8.4 mg/dL — ABNORMAL LOW (ref 8.5–10.1)
Chloride: 103 mmol/L (ref 98–107)
Co2: 31 mmol/L (ref 21–32)
Creatinine: 0.85 mg/dL (ref 0.60–1.30)
Glucose: 116 mg/dL — ABNORMAL HIGH (ref 65–99)
OSMOLALITY: 277 (ref 275–301)
POTASSIUM: 3.7 mmol/L (ref 3.5–5.1)
SGPT (ALT): 15 U/L
Sodium: 139 mmol/L (ref 136–145)
Total Protein: 6.8 g/dL (ref 6.4–8.2)

## 2014-05-16 LAB — URINALYSIS, COMPLETE
Bacteria: NONE SEEN
Bilirubin,UR: NEGATIVE
Blood: NEGATIVE
Glucose,UR: NEGATIVE mg/dL (ref 0–75)
Ketone: NEGATIVE
Leukocyte Esterase: NEGATIVE
NITRITE: NEGATIVE
Ph: 7 (ref 4.5–8.0)
Protein: NEGATIVE
RBC, UR: NONE SEEN /HPF (ref 0–5)
Specific Gravity: 1.003 (ref 1.003–1.030)
WBC UR: NONE SEEN /HPF (ref 0–5)

## 2014-05-16 LAB — CBC CANCER CENTER
BASOS PCT: 0.3 %
Basophil #: 0 x10 3/mm (ref 0.0–0.1)
EOS PCT: 3.3 %
Eosinophil #: 0.1 x10 3/mm (ref 0.0–0.7)
HCT: 33.1 % — AB (ref 35.0–47.0)
HGB: 10.6 g/dL — ABNORMAL LOW (ref 12.0–16.0)
LYMPHS ABS: 1 x10 3/mm (ref 1.0–3.6)
LYMPHS PCT: 27.4 %
MCH: 30 pg (ref 26.0–34.0)
MCHC: 32.1 g/dL (ref 32.0–36.0)
MCV: 93 fL (ref 80–100)
Monocyte #: 0.4 x10 3/mm (ref 0.2–0.9)
Monocyte %: 10.7 %
NEUTROS ABS: 2.2 x10 3/mm (ref 1.4–6.5)
Neutrophil %: 58.3 %
Platelet: 148 x10 3/mm — ABNORMAL LOW (ref 150–440)
RBC: 3.55 10*6/uL — ABNORMAL LOW (ref 3.80–5.20)
RDW: 15.2 % — AB (ref 11.5–14.5)
WBC: 3.7 x10 3/mm (ref 3.6–11.0)

## 2014-05-18 LAB — URINE CULTURE

## 2014-05-24 ENCOUNTER — Ambulatory Visit: Payer: Self-pay | Admitting: Oncology

## 2014-05-26 ENCOUNTER — Ambulatory Visit: Payer: Self-pay | Admitting: Oncology

## 2014-05-27 ENCOUNTER — Ambulatory Visit: Payer: Self-pay | Admitting: Oncology

## 2014-06-06 LAB — CBC CANCER CENTER
BASOS ABS: 0 x10 3/mm (ref 0.0–0.1)
BASOS PCT: 0.3 %
Eosinophil #: 0.1 x10 3/mm (ref 0.0–0.7)
Eosinophil %: 2.3 %
HCT: 30.2 % — AB (ref 35.0–47.0)
HGB: 10.1 g/dL — ABNORMAL LOW (ref 12.0–16.0)
Lymphocyte #: 0.7 x10 3/mm — ABNORMAL LOW (ref 1.0–3.6)
Lymphocyte %: 24.6 %
MCH: 31.2 pg (ref 26.0–34.0)
MCHC: 33.3 g/dL (ref 32.0–36.0)
MCV: 94 fL (ref 80–100)
MONO ABS: 0.3 x10 3/mm (ref 0.2–0.9)
Monocyte %: 10.7 %
Neutrophil #: 1.7 x10 3/mm (ref 1.4–6.5)
Neutrophil %: 62.1 %
Platelet: 134 x10 3/mm — ABNORMAL LOW (ref 150–440)
RBC: 3.23 10*6/uL — ABNORMAL LOW (ref 3.80–5.20)
RDW: 15.3 % — AB (ref 11.5–14.5)
WBC: 2.8 x10 3/mm — ABNORMAL LOW (ref 3.6–11.0)

## 2014-06-06 LAB — PROTIME-INR
INR: 1
Prothrombin Time: 13.1 secs (ref 11.5–14.7)

## 2014-06-06 LAB — COMPREHENSIVE METABOLIC PANEL
ALBUMIN: 3 g/dL — AB (ref 3.4–5.0)
ALT: 16 U/L
AST: 19 U/L (ref 15–37)
Alkaline Phosphatase: 119 U/L — ABNORMAL HIGH
Anion Gap: 5 — ABNORMAL LOW (ref 7–16)
BUN: 13 mg/dL (ref 7–18)
Bilirubin,Total: 0.2 mg/dL (ref 0.2–1.0)
CO2: 31 mmol/L (ref 21–32)
Calcium, Total: 8.1 mg/dL — ABNORMAL LOW (ref 8.5–10.1)
Chloride: 104 mmol/L (ref 98–107)
Creatinine: 0.72 mg/dL (ref 0.60–1.30)
EGFR (African American): >60
GLUCOSE: 119 mg/dL — AB (ref 65–99)
Osmolality: 281 (ref 275–301)
Potassium: 3.2 mmol/L — ABNORMAL LOW (ref 3.5–5.1)
Sodium: 140 mmol/L (ref 136–145)
Total Protein: 6.6 g/dL (ref 6.4–8.2)

## 2014-06-06 LAB — APTT: ACTIVATED PTT: 30.5 s (ref 23.6–35.9)

## 2014-06-10 ENCOUNTER — Ambulatory Visit: Payer: Self-pay | Admitting: Internal Medicine

## 2014-06-12 ENCOUNTER — Ambulatory Visit: Payer: Self-pay | Admitting: Internal Medicine

## 2014-06-16 LAB — PATHOLOGY REPORT

## 2014-06-24 ENCOUNTER — Ambulatory Visit: Payer: Self-pay | Admitting: Oncology

## 2014-07-25 ENCOUNTER — Ambulatory Visit: Payer: Self-pay | Admitting: Oncology

## 2014-07-25 LAB — COMPREHENSIVE METABOLIC PANEL
Albumin: 3.1 g/dL — ABNORMAL LOW (ref 3.4–5.0)
Alkaline Phosphatase: 112 U/L
Anion Gap: 4 — ABNORMAL LOW (ref 7–16)
BUN: 7 mg/dL (ref 7–18)
Bilirubin,Total: 0.3 mg/dL (ref 0.2–1.0)
CALCIUM: 8.1 mg/dL — AB (ref 8.5–10.1)
Chloride: 101 mmol/L (ref 98–107)
Co2: 32 mmol/L (ref 21–32)
Creatinine: 0.75 mg/dL (ref 0.60–1.30)
EGFR (African American): >60
GLUCOSE: 113 mg/dL — AB (ref 65–99)
Osmolality: 273 (ref 275–301)
POTASSIUM: 3.2 mmol/L — AB (ref 3.5–5.1)
SGOT(AST): 22 U/L (ref 15–37)
SGPT (ALT): 14 U/L
Sodium: 137 mmol/L (ref 136–145)
TOTAL PROTEIN: 6.5 g/dL (ref 6.4–8.2)

## 2014-07-25 LAB — CBC CANCER CENTER
Basophil #: 0 x10 3/mm (ref 0.0–0.1)
Basophil %: 0.4 %
Eosinophil #: 0.1 x10 3/mm (ref 0.0–0.7)
Eosinophil %: 4.3 %
HCT: 31.6 % — AB (ref 35.0–47.0)
HGB: 10.6 g/dL — ABNORMAL LOW (ref 12.0–16.0)
Lymphocyte #: 0.6 x10 3/mm — ABNORMAL LOW (ref 1.0–3.6)
Lymphocyte %: 24.7 %
MCH: 31.4 pg (ref 26.0–34.0)
MCHC: 33.5 g/dL (ref 32.0–36.0)
MCV: 94 fL (ref 80–100)
MONOS PCT: 10.8 %
Monocyte #: 0.3 x10 3/mm (ref 0.2–0.9)
NEUTROS PCT: 59.8 %
Neutrophil #: 1.6 x10 3/mm (ref 1.4–6.5)
Platelet: 132 x10 3/mm — ABNORMAL LOW (ref 150–440)
RBC: 3.37 10*6/uL — AB (ref 3.80–5.20)
RDW: 14.7 % — AB (ref 11.5–14.5)
WBC: 2.6 x10 3/mm — AB (ref 3.6–11.0)

## 2014-08-01 ENCOUNTER — Emergency Department: Payer: Self-pay | Admitting: Emergency Medicine

## 2014-08-01 LAB — CBC CANCER CENTER
Basophil #: 0 x10 3/mm (ref 0.0–0.1)
Basophil %: 0.3 %
Eosinophil #: 0.1 x10 3/mm (ref 0.0–0.7)
Eosinophil %: 2.6 %
HCT: 33 % — ABNORMAL LOW (ref 35.0–47.0)
HGB: 10.9 g/dL — ABNORMAL LOW (ref 12.0–16.0)
LYMPHS ABS: 1.1 x10 3/mm (ref 1.0–3.6)
Lymphocyte %: 30.5 %
MCH: 31.1 pg (ref 26.0–34.0)
MCHC: 33.1 g/dL (ref 32.0–36.0)
MCV: 94 fL (ref 80–100)
Monocyte #: 0.4 x10 3/mm (ref 0.2–0.9)
Monocyte %: 12.2 %
Neutrophil #: 2 x10 3/mm (ref 1.4–6.5)
Neutrophil %: 54.4 %
PLATELETS: 139 x10 3/mm — AB (ref 150–440)
RBC: 3.52 10*6/uL — AB (ref 3.80–5.20)
RDW: 15 % — ABNORMAL HIGH (ref 11.5–14.5)
WBC: 3.7 x10 3/mm (ref 3.6–11.0)

## 2014-08-01 LAB — BASIC METABOLIC PANEL
Anion Gap: 4 — ABNORMAL LOW (ref 7–16)
BUN: 7 mg/dL (ref 7–18)
CALCIUM: 8.2 mg/dL — AB (ref 8.5–10.1)
Chloride: 101 mmol/L (ref 98–107)
Co2: 34 mmol/L — ABNORMAL HIGH (ref 21–32)
Creatinine: 0.8 mg/dL (ref 0.60–1.30)
EGFR (African American): >60
EGFR (Non-African Amer.): >60
Glucose: 89 mg/dL (ref 65–99)
Osmolality: 275 (ref 275–301)
Potassium: 2.8 mmol/L — ABNORMAL LOW (ref 3.5–5.1)
Sodium: 139 mmol/L (ref 136–145)

## 2014-08-01 LAB — URINALYSIS, COMPLETE
Bacteria: NONE SEEN
Bilirubin,UR: NEGATIVE
Blood: NEGATIVE
GLUCOSE, UR: NEGATIVE mg/dL (ref 0–75)
Ketone: NEGATIVE
Leukocyte Esterase: NEGATIVE
Nitrite: NEGATIVE
PH: 8 (ref 4.5–8.0)
Protein: NEGATIVE
RBC,UR: 1 /HPF (ref 0–5)
SQUAMOUS EPITHELIAL: NONE SEEN
Specific Gravity: 1.005 (ref 1.003–1.030)
Transitional Epi: <1
WBC UR: <1 /HPF (ref 0–5)

## 2014-08-01 LAB — CBC
HCT: 35.2 % (ref 35.0–47.0)
HGB: 11.2 g/dL — ABNORMAL LOW (ref 12.0–16.0)
MCH: 29.7 pg (ref 26.0–34.0)
MCHC: 31.7 g/dL — ABNORMAL LOW (ref 32.0–36.0)
MCV: 94 fL (ref 80–100)
Platelet: 158 10*3/uL (ref 150–440)
RBC: 3.76 10*6/uL — AB (ref 3.80–5.20)
RDW: 15 % — ABNORMAL HIGH (ref 11.5–14.5)
WBC: 4.2 10*3/uL (ref 3.6–11.0)

## 2014-08-01 LAB — TROPONIN I: Troponin-I: <0.02 ng/mL

## 2014-08-01 LAB — PRO B NATRIURETIC PEPTIDE: B-Type Natriuretic Peptide: 674 pg/mL — ABNORMAL HIGH (ref 0–450)

## 2014-08-08 LAB — MAGNESIUM: MAGNESIUM: 2.3 mg/dL

## 2014-08-08 LAB — POTASSIUM: Potassium: 4.1 mmol/L (ref 3.5–5.1)

## 2014-08-12 DIAGNOSIS — I351 Nonrheumatic aortic (valve) insufficiency: Secondary | ICD-10-CM | POA: Insufficient documentation

## 2014-08-24 ENCOUNTER — Ambulatory Visit: Payer: Self-pay | Admitting: Oncology

## 2014-09-05 LAB — CBC CANCER CENTER
BASOS PCT: 0.7 %
Basophil #: 0 x10 3/mm (ref 0.0–0.1)
EOS PCT: 2.2 %
Eosinophil #: 0.1 x10 3/mm (ref 0.0–0.7)
HCT: 36.6 % (ref 35.0–47.0)
HGB: 11.8 g/dL — ABNORMAL LOW (ref 12.0–16.0)
LYMPHS ABS: 1.1 x10 3/mm (ref 1.0–3.6)
Lymphocyte %: 25.2 %
MCH: 30.5 pg (ref 26.0–34.0)
MCHC: 32.2 g/dL (ref 32.0–36.0)
MCV: 95 fL (ref 80–100)
Monocyte #: 0.3 x10 3/mm (ref 0.2–0.9)
Monocyte %: 8.1 %
NEUTROS PCT: 63.8 %
Neutrophil #: 2.7 x10 3/mm (ref 1.4–6.5)
Platelet: 147 x10 3/mm — ABNORMAL LOW (ref 150–440)
RBC: 3.86 10*6/uL (ref 3.80–5.20)
RDW: 15.2 % — AB (ref 11.5–14.5)
WBC: 4.3 x10 3/mm (ref 3.6–11.0)

## 2014-09-05 LAB — COMPREHENSIVE METABOLIC PANEL
ALBUMIN: 3.4 g/dL (ref 3.4–5.0)
ANION GAP: 7 (ref 7–16)
Alkaline Phosphatase: 117 U/L — ABNORMAL HIGH
BUN: 9 mg/dL (ref 7–18)
Bilirubin,Total: 0.3 mg/dL (ref 0.2–1.0)
CALCIUM: 8.6 mg/dL (ref 8.5–10.1)
CHLORIDE: 101 mmol/L (ref 98–107)
CO2: 31 mmol/L (ref 21–32)
Creatinine: 0.89 mg/dL (ref 0.60–1.30)
EGFR (African American): >60
EGFR (Non-African Amer.): >60
Glucose: 115 mg/dL — ABNORMAL HIGH (ref 65–99)
Osmolality: 277 (ref 275–301)
Potassium: 3.2 mmol/L — ABNORMAL LOW (ref 3.5–5.1)
SGOT(AST): 22 U/L (ref 15–37)
SGPT (ALT): 21 U/L
Sodium: 139 mmol/L (ref 136–145)
TOTAL PROTEIN: 7.2 g/dL (ref 6.4–8.2)

## 2014-09-23 ENCOUNTER — Ambulatory Visit: Payer: Self-pay | Admitting: Oncology

## 2014-10-03 LAB — COMPREHENSIVE METABOLIC PANEL
AST: 21 U/L (ref 15–37)
Albumin: 3.1 g/dL — ABNORMAL LOW (ref 3.4–5.0)
Alkaline Phosphatase: 104 U/L
Anion Gap: 6 — ABNORMAL LOW (ref 7–16)
BUN: 7 mg/dL (ref 7–18)
Bilirubin,Total: 0.3 mg/dL (ref 0.2–1.0)
CALCIUM: 8.2 mg/dL — AB (ref 8.5–10.1)
Chloride: 103 mmol/L (ref 98–107)
Co2: 32 mmol/L (ref 21–32)
Creatinine: 0.86 mg/dL (ref 0.60–1.30)
EGFR (African American): >60
EGFR (Non-African Amer.): >60
Glucose: 85 mg/dL (ref 65–99)
Osmolality: 278 (ref 275–301)
POTASSIUM: 3.1 mmol/L — AB (ref 3.5–5.1)
SGPT (ALT): 21 U/L
Sodium: 141 mmol/L (ref 136–145)
Total Protein: 6.7 g/dL (ref 6.4–8.2)

## 2014-10-03 LAB — CBC CANCER CENTER
BASOS ABS: 0 x10 3/mm (ref 0.0–0.1)
Basophil %: 0.3 %
Eosinophil #: 0.1 x10 3/mm (ref 0.0–0.7)
Eosinophil %: 2 %
HCT: 33.6 % — ABNORMAL LOW (ref 35.0–47.0)
HGB: 11 g/dL — AB (ref 12.0–16.0)
Lymphocyte #: 1.1 x10 3/mm (ref 1.0–3.6)
Lymphocyte %: 26.2 %
MCH: 31.3 pg (ref 26.0–34.0)
MCHC: 32.8 g/dL (ref 32.0–36.0)
MCV: 96 fL (ref 80–100)
MONOS PCT: 9 %
Monocyte #: 0.4 x10 3/mm (ref 0.2–0.9)
Neutrophil #: 2.5 x10 3/mm (ref 1.4–6.5)
Neutrophil %: 62.5 %
PLATELETS: 144 x10 3/mm — AB (ref 150–440)
RBC: 3.52 10*6/uL — AB (ref 3.80–5.20)
RDW: 14.7 % — ABNORMAL HIGH (ref 11.5–14.5)
WBC: 4.1 x10 3/mm (ref 3.6–11.0)

## 2014-10-24 ENCOUNTER — Ambulatory Visit: Payer: Self-pay | Admitting: Oncology

## 2014-11-20 LAB — CBC CANCER CENTER
BASOS PCT: 0.3 %
Basophil #: 0 x10 3/mm (ref 0.0–0.1)
EOS PCT: 1.4 %
Eosinophil #: 0.1 x10 3/mm (ref 0.0–0.7)
HCT: 34.1 % — ABNORMAL LOW (ref 35.0–47.0)
HGB: 11.4 g/dL — ABNORMAL LOW (ref 12.0–16.0)
LYMPHS PCT: 24.5 %
Lymphocyte #: 1 x10 3/mm (ref 1.0–3.6)
MCH: 31.2 pg (ref 26.0–34.0)
MCHC: 33.4 g/dL (ref 32.0–36.0)
MCV: 93 fL (ref 80–100)
MONO ABS: 0.3 x10 3/mm (ref 0.2–0.9)
Monocyte %: 7.5 %
NEUTROS ABS: 2.8 x10 3/mm (ref 1.4–6.5)
Neutrophil %: 66.3 %
Platelet: 147 x10 3/mm — ABNORMAL LOW (ref 150–440)
RBC: 3.66 10*6/uL — AB (ref 3.80–5.20)
RDW: 14.2 % (ref 11.5–14.5)
WBC: 4.2 x10 3/mm (ref 3.6–11.0)

## 2014-11-20 LAB — COMPREHENSIVE METABOLIC PANEL
ALBUMIN: 3.3 g/dL — AB (ref 3.4–5.0)
Alkaline Phosphatase: 112 U/L (ref 46–116)
Anion Gap: 6 — ABNORMAL LOW (ref 7–16)
BUN: 8 mg/dL (ref 7–18)
Bilirubin,Total: 0.2 mg/dL (ref 0.2–1.0)
CHLORIDE: 102 mmol/L (ref 98–107)
Calcium, Total: 7.9 mg/dL — ABNORMAL LOW (ref 8.5–10.1)
Co2: 32 mmol/L (ref 21–32)
Creatinine: 0.75 mg/dL (ref 0.60–1.30)
EGFR (African American): >60
EGFR (Non-African Amer.): >60
Glucose: 87 mg/dL (ref 65–99)
OSMOLALITY: 277 (ref 275–301)
Potassium: 3.4 mmol/L — ABNORMAL LOW (ref 3.5–5.1)
SGOT(AST): 19 U/L (ref 15–37)
SGPT (ALT): 13 U/L — ABNORMAL LOW (ref 14–63)
Sodium: 140 mmol/L (ref 136–145)
Total Protein: 7.1 g/dL (ref 6.4–8.2)

## 2014-11-24 ENCOUNTER — Ambulatory Visit: Payer: Self-pay | Admitting: Oncology

## 2014-12-18 DIAGNOSIS — I272 Pulmonary hypertension, unspecified: Secondary | ICD-10-CM | POA: Insufficient documentation

## 2015-01-08 ENCOUNTER — Ambulatory Visit
Admit: 2015-01-08 | Disposition: A | Discharge status: Home / Self Care | Payer: Self-pay | Attending: Oncology | Admitting: Oncology

## 2015-01-08 ENCOUNTER — Other Ambulatory Visit: Payer: Self-pay | Admitting: Oncology

## 2015-01-08 DIAGNOSIS — M25511 Pain in right shoulder: Secondary | ICD-10-CM

## 2015-01-23 ENCOUNTER — Ambulatory Visit
Admit: 2015-01-23 | Disposition: A | Discharge status: Home / Self Care | Payer: Self-pay | Attending: Oncology | Admitting: Oncology

## 2015-02-02 ENCOUNTER — Inpatient Hospital Stay: Admission: RE | Admit: 2015-02-02 | Payer: Medicare Other | Source: Ambulatory Visit

## 2015-02-13 NOTE — Consult Note (Signed)
Brief Consult Note: Diagnosis: left patellar fracture.   Patient was seen by consultant.   Consult note dictated.   Recommend to proceed with surgery or procedure.   Comments: Low to moderate risk, no additional medications at this time needed. Medically optimized. may need supplemental oxygen after surgery. Had jaw surgery in past- so please notify anesthesiologist.  Electronic Signatures: Vaughan Basta (MD)  (Signed 18-Jun-14 13:04)  Authored: Brief Consult Note   Last Updated: 18-Jun-14 13:04 by Vaughan Basta (MD)

## 2015-02-13 NOTE — Op Note (Signed)
PATIENT NAME:  Michelle Webb, Michelle Webb MR#:  [id number] DATE OF BIRTH:  1932/01/30  DATE OF PROCEDURE:  04/10/2013  PREOPERATIVE DIAGNOSIS: Left displaced patella fracture.   POSTOPERATIVE DIAGNOSIS: Left displaced patella fracture.   PROCEDURE: Open reduction and internal fixation, left patella.   ANESTHESIA: Spinal.   SURGEON: Laurene Footman, M.D.   DESCRIPTION OF PROCEDURE: The patient was brought to the operating room and after adequate anesthesia was obtained, the left leg was prepped and draped in the usual sterile fashion with a tourniquet applied to the upper thigh but not required. After appropriate patient identification and timeout procedures were completed, a midline skin incision was made, followed by evacuation of a large hematoma. The fracture fragments were identified. There were 2 large fragments. The distal one was somewhat thin. The joint was irrigated, and there was some tearing of the retinaculum medially and laterally which was subsequently repaired with #1 Vicryl. The C-arm was brought in, and AP and lateral imaging was obtained with 2 K-wires crossing the fracture from distal to proximal. A cerclage wire was then placed in a figure-of-eight fashion and tightened. This gave good compression, and with range of motion, there is no displacement of the fracture. The distal ends of the wire were then cut and bent over and then driven proximally to hold the figure-of-eight wire and also make them less prominent. The proximal ends of the wire were then cut and bent down into the quadriceps tendon in hopes of minimizing soft tissue irritation. At this point, permanent C-arm views were obtained showing the essentially anatomic alignment. The retinaculum was repaired on both medial and lateral sides using #1 Vicryl in a figure-of-eight fashion, 2-0 Vicryl subcutaneously and skin staples. Xeroform, 4 x 4's, Webril and an Ace wrap then applied. The patient was sent to the recovery room in stable  condition.   ESTIMATED BLOOD LOSS: 25 mL.   COMPLICATIONS: None.   SPECIMEN: None.   IMPLANTS: K-wire x2 with 1 tension band wire.    ____________________________ Laurene Footman, MD mjm:gb D: 04/10/2013 19:23:58 ET T: 04/10/2013 23:44:26 ET JOB#: [id number]  cc: Laurene Footman, MD, <Dictator> Laurene Footman MD ELECTRONICALLY SIGNED 04/11/2013 8:23

## 2015-02-13 NOTE — Discharge Summary (Signed)
PATIENT NAME:  Michelle Webb, Michelle Webb MR#:  [id number] DATE OF BIRTH:  [birth year]  DATE OF ADMISSION:  10/22/2012 DATE OF DISCHARGE:  10/26/2012  FINAL DIAGNOSES: 1.  Influenza A. 2.  Diarrhea secondary to antibiotics.  3.  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  4.  Pancytopenia secondary to chemotherapy.  5.  Mild malnutrition secondary to carcinoma of stomach.   DISCHARGE MEDICATIONS: Duragesic 50 mcg every 3 days. Phenergan 25 mg, suppository every 4 to 6 hours p.r.n. for nausea. Xanax 0.25 mg 3 times a day as needed. Zoloft 25 mg p.o. daily. Imdur 30 mg 2 tablets daily. Zofran 4 mg every 4 to 6 hours p.r.n. for nausea. Prilosec 20 mg daily. Colace 100 mg p.o. daily. Benadryl 25 mg every 4 to 6 hours p.r.n. as needed for generalized itching.   HOME OXYGEN:  None.   DIET: Regular.   RETURN TO WORK:  Not applicable.  ACTIVITY: As tolerated.   FOLLOWUP: The patient is to see me as an outpatient in the [facility] in 2 weeks with repeat blood count.   HISTORY OF PRESENT ILLNESS: The patient is a known patient to me with carcinoma of stomach who is on chemotherapy, right now maintenance chemotherapy with Herceptin who was brought to our clinic with high fever, feeling weak and tired. The patient was checked for influenza virus and was positive. The patient also had flu vaccine a few weeks ago.   The patient's past history, social history and family history have been dictated; note on the chart.   IMPORTANT LABORATORY AND DIAGNOSTIC DATA:  On admission white count was 3.2, hemoglobin 9.0, Platelet count 107,000. Chest x-ray was within normal limits.   HOSPITAL COURSE:  During hospital stay, the patient received intravenous fluid, antibiotics. The patient developed of diarrhea, which was negative for Clostridium difficile toxin. The patient was taken off antibiotics. The patient has pancytopenia secondary to chemotherapy and also mild malnutrition because  of underlying carcinoma of stomach. Gradually the patient's condition improved, started feeling stronger, and was discharged with advice to get follow-up in my office. Overall prognosis is guarded because of underlying condition.    ____________________________ Martie Lee. Laurna Shetley, MD jkc:ct D: 11/07/2012 07:55:40 ET T: 11/07/2012 09:39:43 ET JOB#: [id number]  cc: Delorise Shiner K. Oliva Bustard, MD, <Dictator> Jobe Gibbon MD ELECTRONICALLY SIGNED 11/08/2012 7:57

## 2015-02-13 NOTE — H&P (Signed)
PATIENT NAME:  Michelle Webb, Michelle Webb MR#:  [id number] DATE OF BIRTH:  1931/12/11  DATE OF ADMISSION:  04/10/2013  CHIEF COMPLAINT: Left knee pain.   HISTORY OF PRESENT ILLNESS: The patient is a 81 year old who suffered a fall at home last night. She had inability to ambulate and came into the Emergency Room this morning, where she was found to have a displaced patella fracture. She is being admitted for treatment of this.   PAST MEDICAL HISTORY: Remarkable for gastric cancer for which she is undergoing chemotherapy. Last chemotherapy was 2 days ago, and she gets it every 3 weeks and is followed by Dr. Oliva Bustard. She has been having fatigue. She has a history of anemia, heart murmur, stomach cancer, COPD, reflux, hypertension, breast cancer 40 years ago, diagnosed with adenocarcinoma of the stomach in August 2012.   PAST SURGICAL HISTORY: She has had multiple oral surgeries, bilateral mastectomies and hysterectomy.   ALLERGIES: SHE DOES HAVE A HISTORY OF ALLERGY TO PENICILLIN AND DOES NOT TOLERATE DARVON.   SOCIAL HISTORY: She is a nonsmoker, nondrinker. She lives at home. She has a lot of family support where somebody stays with her daily.   FAMILY HISTORY: Positive for ovarian, breast and  colon cancer.   MEDICATIONS ON ADMISSION:  1. Duragesic 50 mcg per hour patch every 3 days. 2. Diflucan 50 p.o. p.r.n.  3. Alprazolam 0.25 t.i.d. as needed.  4. Promethazine 25 mg suppository q.6 as needed for nausea. 5. Zofran 4 mg disintegrating tablet 3 times a day as needed.  6. Omeprazole 20 mg b.i.d.  7. Colace 100 mg b.i.d. 8. Diphenhydramine 25 mg at bedtime as needed. 9. Imdur 30 mg extended release b.i.d.  10. Sertraline 25 mg at night.   REVIEW OF SYSTEMS: Positive just for severe knee pain and fatigue.  PHYSICAL EXAMINATION: GENERAL: A frail-appearing woman in moderate distress secondary to knee pain.  HEENT: Remarkable for dentures.  NECK: Nontender.  LUNGS: She has no wheezing noted. Clear  breath sounds on the left and right sides. CARDIOVASCULAR: I did not detect a murmur. Regular rate and rhythm.  ABDOMEN: Soft, nontender.  EXTREMITIES: Remarkable for the left leg. She holds the knee in a slightly flexed position. There is a very large swollen knee with intact skin. Distally, she has trace dorsalis pedis and posterior tibial pulse. She is able to flex and extend the toes, and sensation intact.   IMAGING: X-rays reveal a completely displaced distal pole of the patella fracture.   CLINICAL IMPRESSION: Distal pole of patella fracture in a frail patient, but who has been very active with ambulation and taking care of herself.   RECOMMENDATION: ORIF. I reviewed the procedure with her and her family. Will try to get that done later today, with preoperative medical evaluation pending. Also will inform Dr. Oliva Bustard of the patient's admission.   ____________________________ Laurene Footman, MD mjm:OSi D: 04/10/2013 12:25:32 ET T: 04/10/2013 13:13:35 ET JOB#: [id number]  cc: Laurene Footman, MD, <Dictator> Laurene Footman MD ELECTRONICALLY SIGNED 04/10/2013 16:08

## 2015-02-13 NOTE — Discharge Summary (Signed)
PATIENT NAME:  Michelle Webb, Michelle Webb MR#:  [id number] DATE OF BIRTH:  01-23-1932  DATE OF ADMISSION:  04/10/2013 DATE OF DISCHARGE:  04/12/2013   ADMITTING DIAGNOSIS: Left displaced patella fracture.  DISCHARGE DIAGNOSIS: Left displaced patella fracture.  PROCEDURE: Open reduction and internal fixation of left patella.  ANESTHESIA: Spinal.  SURGEON: Laurene Footman, MD  ESTIMATED BLOOD LOSS: 25 mL.   COMPLICATIONS: None.   SPECIMENS: None.  IMPLANTS:  K-wire x2 with 1 tension band wire.  HISTORY: The patient is a 81 year old who suffered a fall at home last night. She had inability to ambulate and came to the emergency room this morning, where she was found to have a displaced left patella fracture. She was admitted for treatment of this.  PHYSICAL EXAMINATION: GENERAL: A frail-appearing woman in moderate distress secondary to knee pain.  HEENT: Remarkable for dentures.  NECK: Nontender.  LUNGS: She has no wheezing noted. Clear breath sounds on the left and right sides. CARDIOVASCULAR: I did not detect a murmur. Regular rate and rhythm.  ABDOMEN: Soft, nontender.  EXTREMITIES: Remarkable for the left leg. She holds the knee in a slightly flexed position. She has a very large swollen knee with intact skin. Distally, she has trace dorsalis pedis and posterior tibial pulses. She is able to flex and extend the toes, and sensation is intact.   HOSPITAL COURSE: The patient was admitted to the hospital on 04/10/2013. She had surgery on that same day and was brought to the orthopedic floor from the PACU in stable condition. Throughout the patient's stay, the patient did well. She did have some nausea and vomiting on postoperative day 1, but it was controlled with phenergan and Zofran. The patient's pain continued to be moderate postoperative day 1, and she was started on fentanyl patch. Most of the patient's nausea, vomiting and pain were thought to be due to gastric cancer. On postoperative day 2,  the patient was doing well. She was tolerating physical therapy. On postoperative day 3, the patient was stable and ready for discharge. Her nausea and vomiting were under control and pain was under control with fentanyl patches. The patient was doing well and ready and stable for discharge to rehab facility.   CONDITION AT DISCHARGE: Stable.   DISCHARGE INSTRUCTIONS:  1. She will gradually increase weightbearing on the affected extremity.  2. She is to elevate the affected foot or leg on 1 or 2 pillows with the foot higher than the knee, and she is to wear thigh-high TED hose on both legs and remove 1 hour per 8-hour shift. Elevate the heels off bed.  3. She may resume a regular diet as tolerated.  4. She is call [facility] if any of the following occur: Bright red bleeding from the incision or wound, fever of 101.5 degrees, redness, swelling or drainage at the incision. Call [facility] if you experience any increased leg pain, numbness or weakness in legs or bowel or bladder symptoms. 5. She is referred to rehab facility for physical therapy and occupational therapy.  6. She is to call [facility] if she has not been contacted within 48 hours of her return to rehab facility. 7. She is to follow up in 2 weeks with [facility].  DISCHARGE MEDICATIONS:  1. Ondansetron 4 mg oral tablet 1 tablet orally 3 times a day.  2. Promethazine 25 mg rectal suppository 1 suppository rectally every 6 hours as needed.  3. Sertraline 25 mg oral tablet 1 tablet orally once a  day in the evening.  4. Alprazolam 0.25 mg 1 tablet orally 3 times a day as needed. 5. Duragesic 50 mcg an hour transdermal film extended-release 1 patch transdermally every 72 hours. 6. Colace 100 mg oral capsule 1 capsule orally 2 times a day.  7. Imdur 30 mg oral tablet extended release 1 tablet orally 2 times a day. 8. Omeprazole 20 mg oral capsule 1 capsule orally 2 times a day. 9. Benadryl 25 mg oral capsule 1 capsule  orally at bedtime. 10. Aspirin 325 mg delayed-release tablet 1 tablet orally once a day. 11. Zofran ODT 4 mg oral tablet 1 tablet orally every 4 hours as needed for pain.   ____________________________ T. Rachelle Hora, PA-C tcg:OSi D: 04/12/2013 12:39:49 ET T: 04/12/2013 13:07:00 ET JOB#: [id number]  cc: T. Rachelle Hora, PA-C, <Dictator> Duanne Guess [place] ELECTRONICALLY SIGNED 04/17/2013 13:04

## 2015-02-13 NOTE — Consult Note (Signed)
PATIENT NAME:  Michelle Webb, SALVADOR MR#:  [id number] DATE OF BIRTH:  Mar 31, 1932  DATE OF CONSULTATION:  04/10/2013  CONSULTING PHYSICIAN:  Ceasar Lund. Anselm Jungling, MD PRIMARY CARE PHYSICIAN: Martie Lee. Choksi, MD PRIMARY CARDIOLOGIST: Corey Skains, MD  CHIEF COMPLAINT: Fall, injury to the knee.   HISTORY OF PRESENT ILLNESS: The patient is a 81 year old female with past medical history of:  1.  Gastric cancer with metastasis on chemotherapy recently. Last chemotherapy was last week.  2.  Weight loss due to cancer.  3.  Breast cancer 30 years ago with bilateral mastectomy, no chemotherapy or radiation.  4.  Osteoarthritis.  5.  Chronic obstructive pulmonary disease.  6.  Facial reconstructive surgery in 1994.  Is living active and healthy life, living alone and able to go her day-to-day activities independently, has family support. Today was going to her backyard to water her plants, and while walking out of the house there was a step. She misjudged the height and she fell down on the floor and hit her knee, has broken patella on left side and brought over here. Orthopedic team is planning to do surgery today and called for medical clearance due to her age, cancer and her history of COPD.   REVIEW OF SYSTEMS:  CONSTITUTIONAL: Negative for fever, fatigue, weakness, weight loss.  EYES: No blurring, double vision, pain or redness.  EARS, NOSE, THROAT: No tinnitus, ear pain or hearing loss.  RESPIRATORY: No cough, wheezing, hemoptysis or shortness of breath.  CARDIOVASCULAR: No chest pain, orthopnea, edema or arrhythmia.  GASTROINTESTINAL: No nausea, vomiting, diarrhea or abdominal pain.  GENITOURINARY: No dysuria, hematuria or increased frequency.  ENDOCRINE: No polyhidria, nocturia or heat or cold intolerance.  SKIN: No acne or rashes.  MUSCULOSKELETAL: No pain in the neck, back, but has pain in the left knee.  NEUROLOGICAL: No numbness, weakness, dysarthria or tremors.  PSYCHIATRIC: No  anxiety, insomnia or bipolar disorder.   PAST MEDICAL HISTORY:  1.  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 Currently on chemotherapy for the last 6 months, following with Dr. Oliva Bustard. Last chemotherapy was last Friday on June 13.  2.  Weight loss.  3.  Anemia of chronic disease due to cancer. 4.  Rheumatoid fever in childhood.  5.  Osteoarthritis.  6.  Depression. 7.  Chronic obstructive pulmonary disease. 8.  Facial reconstructive surgery in 1994.  9.  Bilateral mastectomy in 1971.  10.  Hysterectomy.   ALLERGIES: DARVON AND PENICILLIN.   FAMILY HISTORY: Positive for ovarian cancer and breast cancer plus colon cancer.   SOCIAL HISTORY: She lives at home with her son. Nonsmoker, non-alcohol drinker. She used to work in [employer] for 31 years and as a result of that she has COPD, but she does not need to use any inhalers or oxygen and she does not have any exacerbation symptoms, but she gets short of breath while walking steep for a few yards and going to collect her mail, but had never had problems due to COPD or need of using any inhalers. She is able to perform her day-to-day activities like feeding her dogs, working in the back yard and in the house, without any restrictions of shortness of breath or chest pain.   HOME MEDICATIONS:  1.  Alprazolam 0.25 mg 3 times a day.  2.  Ondansetron 4 mg 3 times a day.  3.  Isosorbide 30 mg tablet take 2 tablets once a day.  4.  Sertraline 25 mg  once a day.  5.  Omeprazole 20 mg 2 tablets once a day.  6.  Promethazine 12.5 mg once a day.   PHYSICAL EXAMINATION: VITAL SIGNS: Temperature 98.2, pulse rate 67, respirations 20, blood pressure 155/77, pulse oximetry 98% on room air.  GENERAL: The patient is fully alert and oriented, does not have any acute distress. She is cooperative with history taking and physical examination. Overall, she looks thin.  HEENT: Head and neck atraumatic. Conjunctivae pink. Mouth  mucosae are wet.  NECK: Supple. No JVD.  RESPIRATORY: Good respiratory effort, clear to auscultation, equal air entry bilaterally. Trachea midline. On the chest on right side, Mediport is present underneath the skin.  CARDIOVASCULAR: Regular rate and rhythm. No murmur appreciated. No edema on the legs.  GASTROINTESTINAL: There is no tenderness or mass. No hepatosplenomegaly palpated. No hernia. Bowel sounds normal. Abdomen is nondistended. No guarding or rigidity.  MUSCULOSKELETAL: Able to move all 3 limbs, except left lower limb due to fracture and pain.  EXTREMITIES: No edema on the legs.  SKIN: No skin rashes. Skin turgor good.  NEUROLOGICAL: Cranial nerves intact. Power 5/5 all 4 limbs. No tremors.   LABORATORY AND RADIOLOGICAL DATA: Glucose 95, BUN 11, creatinine 0.89, sodium 136, potassium 3.7, chloride 102, CO2 of 30, calcium 8.3. WBC 5.1, hemoglobin 9.5, platelet count 156, MCV 89. Prothrombin 13.1, INR 1.0, activated PTT 28.6. X-ray of knee left: Left patellar fracture. X-ray of the chest as in past in December 2013: No acute abnormality. Today's x-ray is not done yet   ASSESSMENT AND RECOMMENDATION: This is a 81 year old female with history of cancer of the stomach, undergoing chemotherapy currently. Has a remote history of occupational exposure to cotton and secondary to that has some baseline chronic obstructive pulmonary disease. Came with patellar fracture and medical consult for risk stratification and medical optimization for surgery.  1.  Chronic obstructive pulmonary disease: There is some baseline lung fibrosis due to her occupational exposure to cotton, but she has never been in need of any inhalers or never had exacerbation episodes needing hospitalization or going to the doctor or requiring any medication or oxygen for that. She was able to do her day-to-day activities without any problem, only she had some mild shortness of breath while walking uphill, and that was also just  getting resolved by taking some rest, so she is just low to moderate risk for the surgery. She might need some supplemental oxygen for a few hours after the surgery, but we can proceed for the surgery without any added medication or any added need.  2.  History of rheumatoid fever in childhood: The patient states that she had rheumatoid fever in  childhood, and she has been following with Dr. Nehemiah Massed and he said that everything is under control. He kept her on isosorbide 30 mg 2 tablets daily and he has been following every 6 months, but no further cardiac work-up has been done, nor has she ever had chest pain while working or walking, so she sounds to be low to moderate risk for surgery and there is no additional work-up needed from cardiac point of view at this time  3.  Depression: She is taking sertraline at home. We recommend it to be continued while she is in the hospital.  4.  Gastric cancer, status post chemotherapy recently last week: She is following with Dr. Oliva Bustard for this issue and her hemoglobin and white cell count are within acceptable range to proceed with surgery without any added  risk. She might drop her white cell count further because today is 6th day after the chemotherapy but if so, then she might need oncology consult in the hospital, but that will not prevent her from going for surgery today. 5.  Pain management and deep vein thrombosis prophylaxis: As per recommendation of orthopedic team. 6.  Code status: Full code. Medical recommendation and plan discussed with family and patient in detail. They understand.   TOTAL TIME SPENT: 50 minutes. Overall, she is low to moderate risk and medically optimized to undergo patellar surgery today in the afternoon. No additional medication needed at this time.   We are thankful for this consult and will continue following with you.  ____________________________ Ceasar Lund. Anselm Jungling, MD [id number] D: 04/10/2013 13:32:34 ET T: 04/10/2013  14:17:31 ET JOB#: [id number]  cc: Ceasar Lund. Anselm Jungling, MD, <Dictator> Vaughan Basta MD ELECTRONICALLY SIGNED 04/30/2013 14:21

## 2015-02-14 NOTE — Op Note (Signed)
PATIENT NAME:  Michelle Webb, Michelle Webb MR#:  [id number] DATE OF BIRTH:  July 31, 1932  DATE OF PROCEDURE:  12/20/2013  PREOPERATIVE DIAGNOSIS: L3 compression fracture.   POSTOPERATIVE DIAGNOSIS: L3 compression fracture.   PROCEDURE: L3 biopsy and kyphoplasty.    ANESTHESIA: MAC.   SURGEON: Laurene Footman, M.D.   DESCRIPTION OF PROCEDURE: The patient was brought to the operating room and after adequate sedation was given, local anesthetic was infiltrated in the subcutaneous tissue after having completed appropriate patient identification and timeout procedure and having good visualization of the L3 body on both views; 5 mL of 1% Xylocaine was infiltrated on both sides. The back was then prepped and draped in the usual sterile fashion and a repeat timeout procedure carried out. A spinal needle was used to get local down to the pedicle both right and left sides with a 50-50 mix of 1% Xylocaine and 0.5% Sensorcaine with epinephrine. A skin incision was made first on the right. Trocar advanced from a perpendicular approach. Biopsy was obtained, followed by drilling and placing a balloon. Balloon was inflated to 3 mL. It did not cross the midline, and second access on the left side was required to get good fill and correction. The trocar was advanced in a similar fashion, drilling and then inflating balloon. The balloon on the right was then removed and cement used to fill this side with 3 mL placed. The balloon was deflateed on the left and 3 mL of cement placed there as well. There appeared to be good interdigitation of the cement in the vertebral body with no extravasation. Permanent C-arm views were obtained after trocars were removed showing good cement fixation. The wounds were closed with Dermabond, followed by Band-Aids.   ESTIMATED BLOOD LOSS: Minimal.   COMPLICATIONS: None.   SPECIMEN: L3 vertebral body biopsy.   ____________________________ Laurene Footman, MD mjm:gb D: 12/20/2013 18:40:53  ET T: 12/20/2013 22:14:45 ET JOB#: [id number]  cc: Laurene Footman, MD, <Dictator> Laurene Footman MD ELECTRONICALLY SIGNED 12/28/2013 23:44

## 2015-02-14 NOTE — Op Note (Signed)
PATIENT NAME:  Michelle Webb, Michelle Webb MR#:  [id number] DATE OF BIRTH:  1931-11-27  DATE OF PROCEDURE:  11/28/2013  PREOPERATIVE DIAGNOSIS: Painful hardware, left patella.   POSTOPERATIVE DIAGNOSIS: Painful hardware, left patella.   PROCEDURE PERFORMED: Deep hardware removal, left patella.   ANESTHESIA: Spinal.   SURGEON: Hessie Knows, M.D.   DESCRIPTION OF PROCEDURE: The patient was brought to the operating room and after adequate anesthesia was obtained, the left knee was prepped and draped in the usual sterile fashion with a tourniquet applied to the upper thigh but not utilized. After appropriate patient identification, timeout procedures were completed. The distal portion of the incision was opened, and the skin was noted to be quite adherent to the metal. The skin was elevated and the wires exposed proximally and distally. The two straight K wires were removed from distal to proximal, pulling out distally because the distal ends had been hooked proximally, just slightly bent. Following this, the figure-of-eight wire could be identified and cut in 3 places to allow for removal without soft tissue damage. The wound was irrigated and the skin was mobilized to make sure it was not adherent to the underlying patella. The wound was closed with skin staples. Xeroform, 4 x 4's, Webril and Ace wrap were applied, and the patient was sent to the recovery room in stable condition.   ESTIMATED BLOOD LOSS: Minimal.   COMPLICATIONS: None.   SPECIMEN: None.   Hardware was discarded, as it was sharp and was not deemed safe to give back to the patient. Additionally, on the findings, the patella was completely healed.    ____________________________ Laurene Footman, MD mjm:dmm D: 11/28/2013 18:38:24 ET T: 11/28/2013 22:58:14 ET JOB#: [id number]  cc: Laurene Footman, MD, <Dictator> Laurene Footman MD ELECTRONICALLY SIGNED 11/29/2013 8:22

## 2015-02-15 NOTE — Discharge Summary (Signed)
PATIENT NAME:  Michelle Webb, RUTT MR#:  [id number] DATE OF BIRTH:  07-Nov-1931  DATE OF ADMISSION:  12/07/2011 DATE OF DISCHARGE:  12/09/2011  HISTORY: Mrs. Booz is a 81 year old lady, a known patient to me, with carcinoma of stomach and GE junction, adenocarcinoma, HER-2 positive tumor, metastatic disease to lung and mediastinal lymph node who received chemotherapy with FOLFOX and Herceptin on Friday, 12/02/2011. The patient reported to the [facility] with feeling extremely weak, tired, and dizzy with near syncopal attack. The patient was hypotensive. In view of these findings, the patient was admitted in the hospital with diagnosis of dehydration secondary to chemotherapy. The patient had poor oral intake.   For detailed present history and examination, please refer to dictated note on the chart.   AVAILABLE LAB DATA:  On 12/08/2011 glucose was 85, sodium 133, and potassium 3.9. BUN and creatinine were stable. Calcium was 8.3. White count was 2.6, hemoglobin 10.3, and platelet count 118,000. On 12/09/2011 white count was 3.3, hemoglobin 9.6, and platelet count 108,000.   HOSPITAL COURSE: During the hospital stay, the patient was started on IV fluid, IV steroid, and nausea medications. The patient's condition improved so was discharged and was advised to get followup as an outpatient.   FINAL DIAGNOSES:  1. Syncopal attack secondary to hypotension. 2. Hypotension secondary to dehydration. 3. Dehydration secondary to nausea, vomiting, and decreased intake due to chemotherapy and underlying carcinoma of stomach, metastatic disease to lung and lymph node, stage IV disease.   DISCHARGE CONDITION: At the time of discharge, the patient's condition is improved. Her overall prognosis is poor because of underlying situation. Consult was palliative care.   DISCHARGE MEDICATIONS:  1. Zofran 4 mg every 4 hours p.r.n. for nausea.  2. Fentanyl patch 12 mcg every 72 hours.  3. Xanax 0.25 mg three times  daily. 4. Albuterol inhaler 2 puffs every four hours p.r.n.  5. Imdur 30 mg extended-release tablet once a day.  6. Prilosec 20 mg twice a day. 7. Metoprolol 25 mg two times a day. 8. Zoloft 25 mg once a day.   DIET: As tolerated.   ACTIVITY: As tolerated.  DISCHARGE FOLLOWUP: An appointment is scheduled to see me as an outpatient.  ____________________________ Martie Lee. Zaylin Pistilli, MD jkc:slb D: 12/17/2011 07:12:00 ET T: 12/17/2011 14:34:28 ET JOB#: [id number]  cc: Delorise Shiner K. Oliva Bustard, MD, <Dictator> Jobe Gibbon MD ELECTRONICALLY SIGNED 12/17/2011 18:42

## 2015-02-19 ENCOUNTER — Other Ambulatory Visit: Payer: Self-pay | Admitting: Oncology

## 2015-02-19 DIAGNOSIS — R270 Ataxia, unspecified: Secondary | ICD-10-CM

## 2015-02-19 DIAGNOSIS — C169 Malignant neoplasm of stomach, unspecified: Secondary | ICD-10-CM

## 2015-02-19 LAB — CBC CANCER CENTER
BASOS PCT: 0.1 %
Basophil #: 0 x10 3/mm (ref 0.0–0.1)
EOS PCT: 0.2 %
Eosinophil #: 0 x10 3/mm (ref 0.0–0.7)
HCT: 30.4 % — ABNORMAL LOW (ref 35.0–47.0)
HGB: 10.2 g/dL — ABNORMAL LOW (ref 12.0–16.0)
LYMPHS ABS: 0.4 x10 3/mm — AB (ref 1.0–3.6)
LYMPHS PCT: 6.6 %
MCH: 31.5 pg (ref 26.0–34.0)
MCHC: 33.6 g/dL (ref 32.0–36.0)
MCV: 94 fL (ref 80–100)
MONO ABS: 0.4 x10 3/mm (ref 0.2–0.9)
Monocyte %: 6.7 %
NEUTROS PCT: 86.4 %
Neutrophil #: 5.7 x10 3/mm (ref 1.4–6.5)
Platelet: 183 x10 3/mm (ref 150–440)
RBC: 3.24 10*6/uL — AB (ref 3.80–5.20)
RDW: 13.9 % (ref 11.5–14.5)
WBC: 6.6 x10 3/mm (ref 3.6–11.0)

## 2015-02-19 LAB — COMPREHENSIVE METABOLIC PANEL
ALK PHOS: 88 U/L
ALT: 19 U/L
Albumin: 3 g/dL — ABNORMAL LOW
Anion Gap: 7 (ref 7–16)
BILIRUBIN TOTAL: 0.4 mg/dL
BUN: 15 mg/dL
CREATININE: 0.82 mg/dL
Calcium, Total: 7.9 mg/dL — ABNORMAL LOW
Chloride: 97 mmol/L — ABNORMAL LOW
Co2: 28 mmol/L
EGFR (African American): >60
EGFR (Non-African Amer.): >60
Glucose: 123 mg/dL — ABNORMAL HIGH
Potassium: 2.6 mmol/L — ABNORMAL LOW
SGOT(AST): 40 U/L
Sodium: 132 mmol/L — ABNORMAL LOW
TOTAL PROTEIN: 6.5 g/dL

## 2015-02-19 LAB — FOLATE: Folic Acid: 34 ng/mL

## 2015-02-25 ENCOUNTER — Telehealth: Payer: Self-pay | Admitting: *Deleted

## 2015-02-25 ENCOUNTER — Other Ambulatory Visit: Payer: Self-pay | Admitting: *Deleted

## 2015-02-25 DIAGNOSIS — E876 Hypokalemia: Secondary | ICD-10-CM

## 2015-02-25 MED ORDER — POTASSIUM CHLORIDE 20 MEQ PO PACK: 20.0000 meq | PACK | Freq: Once | ORAL | Status: DC

## 2015-02-25 MED ORDER — POTASSIUM CHLORIDE 20 MEQ/100ML IV SOLN
20.0000 meq | Freq: Once | INTRAVENOUS | Status: DC
Start: 2015-02-26 — End: 2015-02-25

## 2015-02-25 NOTE — Telephone Encounter (Signed)
Called pt to see if is taking potassium supplement at home. Was able to talk with daughter, Peter Congo, who stated that patient is not taking medication at this time. Informed daughter that will call in medicine but will also schedule pt to come to clinic to receive infusion for potassium on 5/5. Will recheck potassium level at next visit on 5/10.

## 2015-02-26 ENCOUNTER — Other Ambulatory Visit: Payer: Self-pay | Admitting: *Deleted

## 2015-02-26 ENCOUNTER — Encounter: Payer: Self-pay | Admitting: *Deleted

## 2015-02-26 ENCOUNTER — Emergency Department
Admission: EM | Admit: 2015-02-26 | Discharge: 2015-02-26 | Disposition: A | Discharge status: Home / Self Care | Payer: Medicare Other | Attending: Emergency Medicine | Admitting: Emergency Medicine

## 2015-02-26 ENCOUNTER — Emergency Department: Payer: Medicare Other

## 2015-02-26 ENCOUNTER — Inpatient Hospital Stay: Payer: Medicare Other | Attending: Oncology

## 2015-02-26 VITALS — BP 170/90 | HR 69

## 2015-02-26 DIAGNOSIS — E876 Hypokalemia: Secondary | ICD-10-CM

## 2015-02-26 DIAGNOSIS — E538 Deficiency of other specified B group vitamins: Secondary | ICD-10-CM | POA: Diagnosis not present

## 2015-02-26 DIAGNOSIS — Z79899 Other long term (current) drug therapy: Secondary | ICD-10-CM | POA: Insufficient documentation

## 2015-02-26 DIAGNOSIS — R5381 Other malaise: Secondary | ICD-10-CM | POA: Diagnosis not present

## 2015-02-26 DIAGNOSIS — D649 Anemia, unspecified: Secondary | ICD-10-CM | POA: Diagnosis not present

## 2015-02-26 DIAGNOSIS — R531 Weakness: Secondary | ICD-10-CM | POA: Diagnosis not present

## 2015-02-26 DIAGNOSIS — R0602 Shortness of breath: Secondary | ICD-10-CM | POA: Diagnosis not present

## 2015-02-26 DIAGNOSIS — R61 Generalized hyperhidrosis: Secondary | ICD-10-CM | POA: Diagnosis not present

## 2015-02-26 DIAGNOSIS — R079 Chest pain, unspecified: Secondary | ICD-10-CM | POA: Insufficient documentation

## 2015-02-26 DIAGNOSIS — R110 Nausea: Secondary | ICD-10-CM | POA: Diagnosis not present

## 2015-02-26 DIAGNOSIS — C169 Malignant neoplasm of stomach, unspecified: Secondary | ICD-10-CM | POA: Insufficient documentation

## 2015-02-26 DIAGNOSIS — R634 Abnormal weight loss: Secondary | ICD-10-CM | POA: Insufficient documentation

## 2015-02-26 DIAGNOSIS — R630 Anorexia: Secondary | ICD-10-CM | POA: Diagnosis not present

## 2015-02-26 DIAGNOSIS — Z880 Allergy status to penicillin: Secondary | ICD-10-CM | POA: Diagnosis not present

## 2015-02-26 DIAGNOSIS — R6884 Jaw pain: Secondary | ICD-10-CM | POA: Insufficient documentation

## 2015-02-26 DIAGNOSIS — H9190 Unspecified hearing loss, unspecified ear: Secondary | ICD-10-CM | POA: Insufficient documentation

## 2015-02-26 LAB — CBC WITH DIFFERENTIAL/PLATELET
BASOS ABS: 0 10*3/uL (ref 0–0.1)
BASOS PCT: 0 %
Eosinophils Absolute: 0.1 10*3/uL (ref 0–0.7)
Eosinophils Relative: 1 %
HCT: 25.8 % — ABNORMAL LOW (ref 35.0–47.0)
Hemoglobin: 8.7 g/dL — ABNORMAL LOW (ref 12.0–16.0)
Lymphocytes Relative: 6 %
Lymphs Abs: 0.6 10*3/uL — ABNORMAL LOW (ref 1.0–3.6)
MCH: 31 pg (ref 26.0–34.0)
MCHC: 33.6 g/dL (ref 32.0–36.0)
MCV: 92.3 fL (ref 80.0–100.0)
MONO ABS: 0.5 10*3/uL (ref 0.2–0.9)
Monocytes Relative: 5 %
NEUTROS ABS: 8 10*3/uL — AB (ref 1.4–6.5)
NEUTROS PCT: 88 %
Platelets: 215 10*3/uL (ref 150–440)
RBC: 2.8 MIL/uL — ABNORMAL LOW (ref 3.80–5.20)
RDW: 14.1 % (ref 11.5–14.5)
WBC: 9.1 10*3/uL (ref 3.6–11.0)

## 2015-02-26 LAB — BASIC METABOLIC PANEL
ANION GAP: 7 (ref 5–15)
BUN: 14 mg/dL (ref 6–20)
CALCIUM: 7.4 mg/dL — AB (ref 8.9–10.3)
CO2: 31 mmol/L (ref 22–32)
CREATININE: 0.64 mg/dL (ref 0.44–1.00)
Chloride: 97 mmol/L — ABNORMAL LOW (ref 101–111)
GFR calc Af Amer: >60 mL/min (ref >60)
GFR calc non Af Amer: >60 mL/min (ref >60)
Glucose, Bld: 89 mg/dL (ref 65–99)
Potassium: 3.1 mmol/L — ABNORMAL LOW (ref 3.5–5.1)
Sodium: 135 mmol/L (ref 135–145)

## 2015-02-26 LAB — TROPONIN I: TROPONIN I: 0.03 ng/mL (ref <0.031)

## 2015-02-26 MED ORDER — HEPARIN SOD (PORK) LOCK FLUSH 10 UNIT/ML IV SOLN: 10.0000 [IU] | Freq: Once | INTRAVENOUS | Status: DC

## 2015-02-26 MED ORDER — KETOROLAC TROMETHAMINE 15 MG/ML IJ SOLN
15.0000 mg | Freq: Once | INTRAMUSCULAR | Status: AC
  Administered 2015-02-26: 15 mg via INTRAVENOUS

## 2015-02-26 MED ORDER — HEPARIN SOD (PORK) LOCK FLUSH 100 UNIT/ML IV SOLN
INTRAVENOUS | Status: AC
  Filled 2015-02-26: qty 5

## 2015-02-26 MED ORDER — SODIUM CHLORIDE 0.9 % IV SOLN: 40.0000 meq | Freq: Once | INTRAVENOUS | Status: DC

## 2015-02-26 MED ORDER — SODIUM CHLORIDE 0.9 % IJ SOLN
10.0000 mL | Freq: Once | INTRAMUSCULAR | Status: AC
  Administered 2015-02-26: 10 mL via INTRAVENOUS
  Filled 2015-02-26: qty 10

## 2015-02-26 MED ORDER — KETOROLAC TROMETHAMINE 15 MG/ML IJ SOLN
INTRAMUSCULAR | Status: AC
  Filled 2015-02-26: qty 1

## 2015-02-26 MED ORDER — POTASSIUM CHLORIDE 20 MEQ PO PACK
PACK | ORAL | Status: AC
  Administered 2015-02-26: 20 meq via ORAL
  Filled 2015-02-26: qty 1

## 2015-02-26 MED ORDER — SODIUM CHLORIDE 0.9 % IV SOLN
Freq: Once | INTRAVENOUS | Status: AC
  Administered 2015-02-26: 15:00:00 via INTRAVENOUS
  Filled 2015-02-26: qty 250

## 2015-02-26 MED ORDER — POTASSIUM CHLORIDE 20 MEQ PO PACK
20.0000 meq | PACK | Freq: Once | ORAL | Status: AC
  Administered 2015-02-26: 20 meq via ORAL

## 2015-02-26 MED ORDER — HEPARIN SOD (PORK) LOCK FLUSH 100 UNIT/ML IV SOLN
500.0000 [IU] | Freq: Once | INTRAVENOUS | Status: DC
  Filled 2015-02-26: qty 5

## 2015-02-26 NOTE — ED Provider Notes (Signed)
[facility] Emergency Department Provider Note    ____________________________________________  Time seen: 1830  I have reviewed the triage vital signs and the nursing notes.   HISTORY  Chief Complaint Chest Pain  episode of diaphoresis during potassium infusion   HPI Michelle Webb is a 81 y.o. female with a history of gastric cancer treated at [facility]. The other day she had a noted low potassium level and was brought back to the cancer center for a potassium infusion through her port. Towards the end of the infusion the patient became diaphoretic and began to have chest pain that radiated into her jaw. The patient was treated with 15 mg of Toradol on site and then moved to the emergency department. The patient reports this pain lasted approximately 5 minutes and now has fully resolved.  The sons report that this patient is ordinarily taking potassium at home but has run out of the powdered formulation which she needs. The insurance company is saying they will not pay for the powder and she cannot swallow the larger pills.    No past medical history on file.  Past medical history noted: Pulmonary hypertension, hypokalemia, aortic insufficiency, gastric cancer, hypertension, gastroesophageal reflux disease.  There are no active problems to display for this patient.   No past surgical history on file.  Past surgical history noted: Appendectomy, mastectomy, total hysterectomy, vesicovaginal fistula closure, esophageal dilatation.  Current Outpatient Rx  Name  Route  Sig  Dispense  Refill  . potassium chloride (KLOR-CON) 20 MEQ packet   Oral   Take 20 mEq by mouth once.   30 packet   3     Allergies Penicillins  No family history on file.  Social History History  Substance Use Topics  . Smoking status: Never Smoker   . Smokeless tobacco: Not on file  . Alcohol Use: No    Review of Systems  Constitutional: Negative  for fever. Notable for an increased weakness over the past week or so with some falls. Eyes: Negative for visual changes. ENT: Negative for sore throat. Cardiovascular: Positive for chest pain. See history of present illness Respiratory: Negative for shortness of breath. Gastrointestinal: Negative for abdominal pain, vomiting and diarrhea. Genitourinary: Negative for dysuria. Musculoskeletal: Negative for back pain. Skin: Negative for rash. Patient was diaphoretic during her episode today. Neurological: Negative for headaches. .  10-point ROS otherwise negative.  ____________________________________________   PHYSICAL EXAM:  VITAL SIGNS: ED Triage Vitals  Enc Vitals Group     BP 02/26/15 1751 180/61 mmHg     Pulse Rate 02/26/15 1751 63     Resp 02/26/15 1751 20     Temp 02/26/15 1751 98.6 F (37 C)     Temp Source 02/26/15 1751 Oral     SpO2 02/26/15 1751 94 %     Weight 02/26/15 1751 76 lb (34.473 kg)     Height 02/26/15 1751 5' (1.524 m)     Head Cir --      Peak Flow --      Pain Score 02/26/15 1753 4     Pain Loc --      Pain Edu? --      Excl. in [place]? --      Constitutional: Alert and oriented. Well appearing and in no distress. Notably thin. Noted hard of hearing. ENT   Head: Normocephalic and atraumatic.   Nose: No congestion/rhinnorhea.   Mouth/Throat: Mucous membranes are moist.   Neck: No stridor.  Cardiovascular: Normal rate, regular rhythm. Normal and symmetric distal pulses are present in all extremities. No murmurs, rubs, or gallops. Respiratory: Normal respiratory effort without tachypnea nor retractions. Breath sounds are clear and equal bilaterally. No wheezes/rales/rhonchi. Gastrointestinal: Soft and nontender. No distention. No abdominal bruits. There is no CVA tenderness. Musculoskeletal: Nontender with normal range of motion in all extremities. No joint effusions.  No lower extremity tenderness nor edema. Neurologic:  Normal speech and  language, though limited by her hard of hearing  Skin:  Skin is warm, dry and intact. No rash noted. Psychiatric: Mood and affect are normal and at baseline for the patient.   ____________________________________________    LABS (pertinent positives/negatives)  Potassium 3.1 which is notably improved from her 2.6 a few days ago.  White blood cell count of 9.1 with a hemoglobin low at 8.7. Troponin is normal at 0.03.  ____________________________________________   EKG  At 1747 EKG shows sinus rhythm at a rate of 66 with a short PR interval and occasional premature atrial complexes. Normal access.  ____________________________________________    RADIOLOGY  X-ray shows bibasilar airspace disease: Edema versus bibasilar pneumonia. Bilateral small effusions.  ____________________________________________   PROCEDURES  Procedure(s) performed: None  Critical Care performed: No  ____________________________________________   INITIAL IMPRESSION / ASSESSMENT AND PLAN / ED COURSE Patient has had complete resolution of her symptoms from the cancer center. She overall looks well. We will test her potassium level currently. We have spoken with her and her sons about ongoing potassium replacement. We will check a chest x-ray given the sons concerns for a mild cough with some brown production. The patient has a PET scan pending for tomorrow.   ----------------------------------------- 8:53 PM on 02/26/2015 -----------------------------------------  Labs and x-ray have been reviewed. Potassium is 3.1 as noted. We have given her a 20 mEq oral dose as well to continue to give her good reserves for her potassium as the patient and family sort through how to source the potassium replacement product they prefer.  The chest x-ray is nonspecific: Edema versus bibasilar pneumonia. With a normal white blood cell count and close follow-up we will avoid complicating the care of this patient.  Patient follow-up with Dr. Oliva Bustard. ____________________________________________   FINAL CLINICAL IMPRESSION(S) / ED DIAGNOSES  Final diagnoses:  Hypokalemia  Chest pain, unspecified chest pain type     Ahmed Prima, MD 02/26/15 2057

## 2015-02-26 NOTE — Progress Notes (Signed)
Pt had finished potassium infusion, when she started having chest pain radiating to jaw rated it 10. Dr Oliva Bustard here orders given , toradol 15 mg given, pt refused morphine.initial b/p 178/76, hr 77. 1719 b/p 184 83, hr 73, rates pain 7-8.1730 b/p 170/90, hr 69 pain still at 7-8 to ED via W/C with 2 Nurses. Pt states she fell on metal walker approx a week ago striking her ribs. Pt to ED with port accessed

## 2015-02-26 NOTE — ED Notes (Signed)
Pt sent to er for an eval of chest pain.   Per dr Oliva Bustard, pt was getting potassium in his clinicand pt developed chest pain.  Pt became diaphoretic.  toradol given in clinic  On arrival to er treatment room, skin warm and dry.  Alert.  States chest pain improved.

## 2015-02-27 ENCOUNTER — Ambulatory Visit
Admission: RE | Admit: 2015-02-27 | Discharge: 2015-02-27 | Disposition: A | Discharge status: Home / Self Care | Payer: Medicare Other | Source: Ambulatory Visit | Attending: Oncology | Admitting: Oncology

## 2015-02-27 DIAGNOSIS — J984 Other disorders of lung: Secondary | ICD-10-CM | POA: Diagnosis not present

## 2015-02-27 DIAGNOSIS — C169 Malignant neoplasm of stomach, unspecified: Secondary | ICD-10-CM | POA: Insufficient documentation

## 2015-02-27 DIAGNOSIS — R270 Ataxia, unspecified: Secondary | ICD-10-CM | POA: Diagnosis not present

## 2015-02-27 DIAGNOSIS — J90 Pleural effusion, not elsewhere classified: Secondary | ICD-10-CM | POA: Diagnosis not present

## 2015-02-27 HISTORY — DX: Malignant (primary) neoplasm, unspecified: C80.1

## 2015-02-27 LAB — GLUCOSE, CAPILLARY: Glucose-Capillary: 81 mg/dL (ref 70–99)

## 2015-02-27 MED ORDER — FLUDEOXYGLUCOSE F - 18 (FDG) INJECTION
12.5900 | Freq: Once | INTRAVENOUS | Status: AC | PRN
  Administered 2015-02-27: 12.59 via INTRAVENOUS

## 2015-03-02 ENCOUNTER — Ambulatory Visit
Admission: RE | Admit: 2015-03-02 | Discharge: 2015-03-02 | Disposition: A | Discharge status: Home / Self Care | Payer: Medicare Other | Source: Ambulatory Visit | Attending: Oncology | Admitting: Oncology

## 2015-03-02 DIAGNOSIS — R270 Ataxia, unspecified: Secondary | ICD-10-CM

## 2015-03-02 DIAGNOSIS — C169 Malignant neoplasm of stomach, unspecified: Secondary | ICD-10-CM | POA: Diagnosis not present

## 2015-03-02 MED ORDER — IOHEXOL 300 MG/ML  SOLN
75.0000 mL | Freq: Once | INTRAMUSCULAR | Status: AC | PRN
  Administered 2015-03-02: 75 mL via INTRAVENOUS

## 2015-03-03 ENCOUNTER — Inpatient Hospital Stay: Payer: Medicare Other

## 2015-03-03 ENCOUNTER — Inpatient Hospital Stay (HOSPITAL_BASED_OUTPATIENT_CLINIC_OR_DEPARTMENT_OTHER): Payer: Medicare Other | Admitting: Oncology

## 2015-03-03 VITALS — BP 177/72 | HR 73 | Temp 95.8°F | Wt 80.9 lb

## 2015-03-03 DIAGNOSIS — I10 Essential (primary) hypertension: Secondary | ICD-10-CM

## 2015-03-03 DIAGNOSIS — D649 Anemia, unspecified: Secondary | ICD-10-CM

## 2015-03-03 DIAGNOSIS — J90 Pleural effusion, not elsewhere classified: Secondary | ICD-10-CM | POA: Diagnosis not present

## 2015-03-03 DIAGNOSIS — E538 Deficiency of other specified B group vitamins: Secondary | ICD-10-CM

## 2015-03-03 DIAGNOSIS — C169 Malignant neoplasm of stomach, unspecified: Secondary | ICD-10-CM | POA: Diagnosis not present

## 2015-03-03 DIAGNOSIS — E876 Hypokalemia: Secondary | ICD-10-CM

## 2015-03-03 DIAGNOSIS — Z79899 Other long term (current) drug therapy: Secondary | ICD-10-CM

## 2015-03-03 LAB — POTASSIUM: Potassium: 4.2 mmol/L (ref 3.5–5.1)

## 2015-03-03 MED ORDER — FENTANYL 50 MCG/HR TD PT72: 50.0000 ug | MEDICATED_PATCH | TRANSDERMAL | Status: DC

## 2015-03-04 ENCOUNTER — Telehealth: Payer: Self-pay | Admitting: *Deleted

## 2015-03-04 DIAGNOSIS — N3000 Acute cystitis without hematuria: Secondary | ICD-10-CM

## 2015-03-04 MED ORDER — PHENAZOPYRIDINE HCL 100 MG PO TABS: 100.0000 mg | ORAL_TABLET | Freq: Three times a day (TID) | ORAL | Status: DC

## 2015-03-04 MED ORDER — CIPROFLOXACIN HCL 250 MG PO TABS: 250.0000 mg | ORAL_TABLET | Freq: Two times a day (BID) | ORAL | Status: DC

## 2015-03-04 NOTE — Telephone Encounter (Signed)
Legrand Como notified that rx sent to [facility]

## 2015-03-04 NOTE — Telephone Encounter (Signed)
Having burning with urination. Discussed with md nurse yesterday, but needs toknow what to take for it.

## 2015-03-06 ENCOUNTER — Other Ambulatory Visit: Payer: Self-pay | Admitting: Radiology

## 2015-03-09 ENCOUNTER — Other Ambulatory Visit: Payer: Self-pay | Admitting: Oncology

## 2015-03-09 ENCOUNTER — Ambulatory Visit
Admission: RE | Admit: 2015-03-09 | Discharge: 2015-03-09 | Disposition: A | Discharge status: Home / Self Care | Payer: Medicare Other | Source: Ambulatory Visit | Attending: Oncology | Admitting: Oncology

## 2015-03-09 DIAGNOSIS — Z9181 History of falling: Secondary | ICD-10-CM

## 2015-03-09 DIAGNOSIS — R64 Cachexia: Secondary | ICD-10-CM | POA: Diagnosis present

## 2015-03-09 DIAGNOSIS — J9811 Atelectasis: Secondary | ICD-10-CM | POA: Diagnosis present

## 2015-03-09 DIAGNOSIS — B370 Candidal stomatitis: Secondary | ICD-10-CM | POA: Diagnosis present

## 2015-03-09 DIAGNOSIS — J90 Pleural effusion, not elsewhere classified: Secondary | ICD-10-CM

## 2015-03-09 DIAGNOSIS — K219 Gastro-esophageal reflux disease without esophagitis: Secondary | ICD-10-CM | POA: Diagnosis present

## 2015-03-09 DIAGNOSIS — K224 Dyskinesia of esophagus: Secondary | ICD-10-CM | POA: Diagnosis present

## 2015-03-09 DIAGNOSIS — I10 Essential (primary) hypertension: Secondary | ICD-10-CM | POA: Diagnosis present

## 2015-03-09 DIAGNOSIS — J9600 Acute respiratory failure, unspecified whether with hypoxia or hypercapnia: Secondary | ICD-10-CM | POA: Diagnosis present

## 2015-03-09 DIAGNOSIS — C7800 Secondary malignant neoplasm of unspecified lung: Secondary | ICD-10-CM | POA: Diagnosis present

## 2015-03-09 DIAGNOSIS — Z881 Allergy status to other antibiotic agents status: Secondary | ICD-10-CM

## 2015-03-09 DIAGNOSIS — C169 Malignant neoplasm of stomach, unspecified: Secondary | ICD-10-CM | POA: Diagnosis present

## 2015-03-09 DIAGNOSIS — Z66 Do not resuscitate: Secondary | ICD-10-CM | POA: Diagnosis present

## 2015-03-09 DIAGNOSIS — F329 Major depressive disorder, single episode, unspecified: Secondary | ICD-10-CM | POA: Diagnosis present

## 2015-03-09 DIAGNOSIS — Z880 Allergy status to penicillin: Secondary | ICD-10-CM

## 2015-03-09 DIAGNOSIS — D899 Disorder involving the immune mechanism, unspecified: Secondary | ICD-10-CM | POA: Diagnosis present

## 2015-03-09 DIAGNOSIS — A419 Sepsis, unspecified organism: Secondary | ICD-10-CM | POA: Diagnosis not present

## 2015-03-09 DIAGNOSIS — Z681 Body mass index (BMI) 19 or less, adult: Secondary | ICD-10-CM

## 2015-03-09 DIAGNOSIS — Z515 Encounter for palliative care: Secondary | ICD-10-CM

## 2015-03-09 DIAGNOSIS — E440 Moderate protein-calorie malnutrition: Secondary | ICD-10-CM | POA: Diagnosis present

## 2015-03-09 DIAGNOSIS — J189 Pneumonia, unspecified organism: Secondary | ICD-10-CM | POA: Diagnosis present

## 2015-03-09 DIAGNOSIS — I4891 Unspecified atrial fibrillation: Secondary | ICD-10-CM | POA: Diagnosis present

## 2015-03-09 DIAGNOSIS — Z85028 Personal history of other malignant neoplasm of stomach: Secondary | ICD-10-CM

## 2015-03-09 DIAGNOSIS — I517 Cardiomegaly: Secondary | ICD-10-CM | POA: Diagnosis present

## 2015-03-09 DIAGNOSIS — D638 Anemia in other chronic diseases classified elsewhere: Secondary | ICD-10-CM | POA: Diagnosis present

## 2015-03-09 DIAGNOSIS — J449 Chronic obstructive pulmonary disease, unspecified: Secondary | ICD-10-CM | POA: Diagnosis present

## 2015-03-09 DIAGNOSIS — Z79899 Other long term (current) drug therapy: Secondary | ICD-10-CM

## 2015-03-09 DIAGNOSIS — Z853 Personal history of malignant neoplasm of breast: Secondary | ICD-10-CM

## 2015-03-09 DIAGNOSIS — Z888 Allergy status to other drugs, medicaments and biological substances status: Secondary | ICD-10-CM

## 2015-03-09 DIAGNOSIS — Z803 Family history of malignant neoplasm of breast: Secondary | ICD-10-CM

## 2015-03-09 DIAGNOSIS — R1313 Dysphagia, pharyngeal phase: Secondary | ICD-10-CM | POA: Diagnosis present

## 2015-03-09 LAB — BODY FLUID CELL COUNT WITH DIFFERENTIAL
Eos, Fluid: 0 %
Lymphs, Fluid: 14 %
MONOCYTE-MACROPHAGE-SEROUS FLUID: 4 %
Neutrophil Count, Fluid: 82 %
OTHER CELLS FL: 0 %
WBC FLUID: 3034 uL

## 2015-03-09 LAB — PROTEIN, BODY FLUID: Total protein, fluid: 4.3 g/dL

## 2015-03-11 ENCOUNTER — Encounter: Payer: Self-pay | Admitting: Emergency Medicine

## 2015-03-11 ENCOUNTER — Inpatient Hospital Stay
Admission: EM | Admit: 2015-03-11 | Discharge: 2015-03-16 | DRG: 871 | Disposition: A | Discharge status: Hospice | Payer: Medicare Other | Attending: Internal Medicine | Admitting: Internal Medicine

## 2015-03-11 ENCOUNTER — Emergency Department: Payer: Medicare Other

## 2015-03-11 DIAGNOSIS — J189 Pneumonia, unspecified organism: Secondary | ICD-10-CM | POA: Diagnosis present

## 2015-03-11 DIAGNOSIS — A419 Sepsis, unspecified organism: Secondary | ICD-10-CM

## 2015-03-11 DIAGNOSIS — IMO0001 Reserved for inherently not codable concepts without codable children: Secondary | ICD-10-CM | POA: Insufficient documentation

## 2015-03-11 DIAGNOSIS — J9601 Acute respiratory failure with hypoxia: Secondary | ICD-10-CM | POA: Insufficient documentation

## 2015-03-11 HISTORY — DX: Malignant neoplasm of unspecified site of unspecified female breast: C50.919

## 2015-03-11 LAB — CBC WITH DIFFERENTIAL/PLATELET
BASOS PCT: 0 %
Basophils Absolute: 0 10*3/uL (ref 0–0.1)
EOS ABS: 0 10*3/uL (ref 0–0.7)
EOS PCT: 0 %
HCT: 28.7 % — ABNORMAL LOW (ref 35.0–47.0)
Hemoglobin: 9.5 g/dL — ABNORMAL LOW (ref 12.0–16.0)
LYMPHS ABS: 0.6 10*3/uL — AB (ref 1.0–3.6)
Lymphocytes Relative: 5 %
MCH: 30.4 pg (ref 26.0–34.0)
MCHC: 33 g/dL (ref 32.0–36.0)
MCV: 92.1 fL (ref 80.0–100.0)
MONOS PCT: 7 %
Monocytes Absolute: 0.9 10*3/uL (ref 0.2–0.9)
NEUTROS PCT: 88 %
Neutro Abs: 10.8 10*3/uL — ABNORMAL HIGH (ref 1.4–6.5)
PLATELETS: 291 10*3/uL (ref 150–440)
RBC: 3.12 MIL/uL — AB (ref 3.80–5.20)
RDW: 15.5 % — ABNORMAL HIGH (ref 11.5–14.5)
WBC: 12.3 10*3/uL — ABNORMAL HIGH (ref 3.6–11.0)

## 2015-03-11 LAB — PROTIME-INR
INR: 1.12
Prothrombin Time: 14.6 seconds (ref 11.4–15.0)

## 2015-03-11 LAB — APTT: APTT: 46 s — AB (ref 24–36)

## 2015-03-11 LAB — LACTIC ACID, PLASMA: Lactic Acid, Venous: 1.3 mmol/L (ref 0.5–2.0)

## 2015-03-11 MED ORDER — SODIUM CHLORIDE 0.9 % IV BOLUS (SEPSIS)
1500.0000 mL | Freq: Once | INTRAVENOUS | Status: AC
  Administered 2015-03-11: 1500 mL via INTRAVENOUS

## 2015-03-11 NOTE — ED Notes (Addendum)
Pt assisted out of vehicle into w/c; famliy reports Lung tap performed here Monday; family reports increased confusion and weakness; dx with stomach CA and lung mets but under no tx at present; charge nurse called for bed and pt taken immediately to room 6; initial report given to Mickel Baas, [place]

## 2015-03-11 NOTE — ED Provider Notes (Addendum)
[facility] Emergency Department Provider Note  ____________________________________________  Time seen: 8:45 PM  I have reviewed the triage vital signs and the nursing notes.   HISTORY  Chief Complaint Weakness    HPI Michelle Webb is a 81 y.o. female who complains of severe left chest pain and shortness of breath since last night. She had a thoracentesis done 2 days ago due to pleural effusion in the setting of stomach cancer and was improved after the procedure, but now is worse today. No fever at home, but the patient has had very low energy and poor appetite. The pain is sharp, worse with inspiration, nonradiating. No other aggravating or alleviating factors.     Past Medical History  Diagnosis Date  . Cancer     stomach cancer    There are no active problems to display for this patient.   History reviewed. No pertinent past surgical history.  Current Outpatient Rx  Name  Route  Sig  Dispense  Refill  . Acetaminophen 500 MG coapsule   Oral   Take by mouth.         . ALPRAZolam (XANAX) 0.25 MG tablet   Oral   Take by mouth.         . ciprofloxacin (CIPRO) 250 MG tablet   Oral   Take 1 tablet (250 mg total) by mouth 2 (two) times daily.   14 tablet   0   . fentaNYL (DURAGESIC - DOSED MCG/HR) 50 MCG/HR   Transdermal   Place 1 patch (50 mcg total) onto the skin every 3 (three) days.   10 patch   0   . fluconazole (DIFLUCAN) 100 MG tablet               . lisinopril (PRINIVIL,ZESTRIL) 5 MG tablet               . omeprazole (PRILOSEC) 20 MG capsule   Oral   Take by mouth.         . ondansetron (ZOFRAN) 4 MG tablet   Oral   Take by mouth.         . phenazopyridine (PYRIDIUM) 100 MG tablet   Oral   Take 1 tablet (100 mg total) by mouth 3 (three) times daily.   21 tablet   0   . potassium chloride (KLOR-CON) 20 MEQ packet   Oral   Take 20 mEq by mouth once.   30 packet   3   . sertraline (ZOLOFT) 50 MG  tablet   Oral   Take by mouth.           Allergies Aspirin; Clarithromycin; Propoxyphene; and Penicillins  History reviewed. No pertinent family history.  Social History History  Substance Use Topics  . Smoking status: Never Smoker   . Smokeless tobacco: Not on file  . Alcohol Use: No    Review of Systems  Constitutional: No fever or chills. Long term weight loss related to chronic illness Eyes:No blurry vision or double vision.  ENT: No sore throat. Cardiovascular: Left chest pain  Respiratory: Shortness of breath with nonproductive cough  Gastrointestinal: Negative for abdominal pain, vomiting and diarrhea.  No BRBPR or melena. Genitourinary: Negative for dysuria, urinary retention, bloody urine, or difficulty urinating. Musculoskeletal: Negative for back pain. No joint swelling or pain. Skin: Negative for rash. Neurological: Negative for headaches, focal weakness or numbness. Psychiatric:No anxiety or depression.   Endocrine:No hot/cold intolerance, changes in energy, or sleep difficulty.  10-point ROS  otherwise negative.  ____________________________________________   PHYSICAL EXAM:  VITAL SIGNS: ED Triage Vitals  Enc Vitals Group     BP 03/11/15 2034 114/62 mmHg     Pulse Rate 03/11/15 2034 118     Resp 03/11/15 2145 22     Temp 03/11/15 2034 99.1 F (37.3 C)     Temp Source 03/11/15 2034 Oral     SpO2 03/11/15 2034 87 %     Weight 03/11/15 2042 77 lb 12.8 oz (35.29 kg)     Height --      Head Cir --      Peak Flow --      Pain Score --      Pain Loc --      Pain Edu? --      Excl. in [place]? --      Constitutional: Alert and oriented. Moderate distress. Cachectic Eyes: No scleral icterus. No conjunctival pallor. PERRL. EOMI ENT   Head: Normocephalic and atraumatic.   Nose: No congestion/rhinnorhea. No septal hematoma   Mouth/Throat: Dry mucous membranes, no pharyngeal erythema. No peritonsillar mass. No uvula shift.   Neck: No  stridor. No SubQ emphysema. No meningismus. Hematological/Lymphatic/Immunilogical: No cervical lymphadenopathy. Cardiovascular: Tachycardia with heart rate 110. Normal and symmetric distal pulses are present in all extremities. No murmurs, rubs, or gallops. Respiratory: Tachypnea, absent lung sounds in left base with normal lung sounds of her vitals. Breath sounds are clear and equal bilaterally. No wheezes/rales/rhonchi. Gastrointestinal: Soft and nontender. No distention. There is no CVA tenderness.  No rebound, rigidity, or guarding. Genitourinary: deferred Musculoskeletal: Nontender with normal range of motion in all extremities. No joint effusions.  No lower extremity tenderness.  No edema. Neurologic:   Normal speech and language.  CN 2-10 normal. Motor grossly intact. No pronator drift.  Normal gait. No gross focal neurologic deficits are appreciated.  Skin:  Skin is warm, dry and intact. No rash noted.  No petechiae, purpura, or bullae. Psychiatric: Mood and affect are normal. Speech and behavior are normal. Patient exhibits appropriate insight and judgment.  ____________________________________________    LABS (pertinent positives/negatives) (all labs ordered are listed, but only abnormal results are displayed) Labs Reviewed  APTT - Abnormal; Notable for the following:    aPTT 46 (*)    All other components within normal limits  CBC WITH DIFFERENTIAL/PLATELET - Abnormal; Notable for the following:    WBC 12.3 (*)    RBC 3.12 (*)    Hemoglobin 9.5 (*)    HCT 28.7 (*)    RDW 15.5 (*)    Neutro Abs 10.8 (*)    Lymphs Abs 0.6 (*)    All other components within normal limits  CULTURE, BLOOD (ROUTINE X 2)  CULTURE, BLOOD (ROUTINE X 2)  URINE CULTURE  CULTURE, EXPECTORATED SPUTUM-ASSESSMENT  LACTIC ACID, PLASMA  PROTIME-INR  COMPREHENSIVE METABOLIC PANEL  TROPONIN I  LACTIC ACID, PLASMA  LIPASE, BLOOD  URINALYSIS COMPLETEWITH MICROSCOPIC ([facility])     ____________________________________________   EKG  Sinus tachycardia with rate 108, normal axis, normal intervals, normal ST and T segments  ____________________________________________    RADIOLOGY  Chest x-ray shows bilateral pleural effusions.  ____________________________________________   PROCEDURES CRITICAL CARE Performed by: Joni Fears, Ayahna Solazzo   Total critical care time: 35 minutes  Critical care time was exclusive of separately billable procedures and treating other patients.  Critical care was necessary to treat or prevent imminent or life-threatening deterioration.  Critical care was time spent personally by me on the following activities:  development of treatment plan with patient and/or surrogate as well as nursing, discussions with consultants, evaluation of patient's response to treatment, examination of patient, obtaining history from patient or surrogate, ordering and performing treatments and interventions, ordering and review of laboratory studies, ordering and review of radiographic studies, pulse oximetry and re-evaluation of patient's condition.  ____________________________________________   INITIAL IMPRESSION / ASSESSMENT AND PLAN / ED COURSE  Pertinent labs & imaging results that were available during my care of the patient were reviewed by me and considered in my medical decision making (see chart for details).  Patient presents with acute respiratory failure, hypoxic, with tachycardia concerning for pneumothorax versus healthcare associated pneumonia versus significant effusion. No evidence of tension pneumothorax at this time.  ----------------------------------------- 12:11 AM on 03/12/2015 -----------------------------------------  Tachycardia improved with IV fluids. Antibiotics given. Workup so far fairly unremarkable, but I did discuss the patient's case with Dr. Marcille Blanco at 12:00 AM for  admission. ____________________________________________   FINAL CLINICAL IMPRESSION(S) / ED DIAGNOSES  Final diagnoses:  None   early sepsis due to pneumonia of the left lower lung   Carrie Mew, MD 03/12/15 0012  Carrie Mew, MD 03/12/15 [phone]

## 2015-03-11 NOTE — ED Notes (Signed)
Pt given additional warm blankets  

## 2015-03-11 NOTE — ED Notes (Signed)
Spoke with MD, pt not on weight bed, but family states pt was weighed yesterday. MD aware.

## 2015-03-11 NOTE — ED Notes (Signed)
Pt presents to ER alert and in NAD. Pt is disoriented x1. Family at bedside. Family reports that pt has had progressive weakness. Pt reports disorientation today.

## 2015-03-12 ENCOUNTER — Encounter: Payer: Self-pay | Admitting: Internal Medicine

## 2015-03-12 DIAGNOSIS — D72829 Elevated white blood cell count, unspecified: Secondary | ICD-10-CM | POA: Diagnosis not present

## 2015-03-12 DIAGNOSIS — C169 Malignant neoplasm of stomach, unspecified: Secondary | ICD-10-CM

## 2015-03-12 DIAGNOSIS — B370 Candidal stomatitis: Secondary | ICD-10-CM | POA: Diagnosis present

## 2015-03-12 DIAGNOSIS — R1313 Dysphagia, pharyngeal phase: Secondary | ICD-10-CM | POA: Diagnosis present

## 2015-03-12 DIAGNOSIS — J189 Pneumonia, unspecified organism: Secondary | ICD-10-CM | POA: Diagnosis present

## 2015-03-12 DIAGNOSIS — Z79899 Other long term (current) drug therapy: Secondary | ICD-10-CM | POA: Diagnosis not present

## 2015-03-12 DIAGNOSIS — A419 Sepsis, unspecified organism: Secondary | ICD-10-CM | POA: Diagnosis present

## 2015-03-12 DIAGNOSIS — J90 Pleural effusion, not elsewhere classified: Secondary | ICD-10-CM | POA: Diagnosis present

## 2015-03-12 DIAGNOSIS — R1310 Dysphagia, unspecified: Secondary | ICD-10-CM | POA: Diagnosis not present

## 2015-03-12 DIAGNOSIS — D899 Disorder involving the immune mechanism, unspecified: Secondary | ICD-10-CM | POA: Diagnosis present

## 2015-03-12 DIAGNOSIS — F329 Major depressive disorder, single episode, unspecified: Secondary | ICD-10-CM | POA: Diagnosis present

## 2015-03-12 DIAGNOSIS — R1013 Epigastric pain: Secondary | ICD-10-CM

## 2015-03-12 DIAGNOSIS — K224 Dyskinesia of esophagus: Secondary | ICD-10-CM | POA: Diagnosis present

## 2015-03-12 DIAGNOSIS — J9600 Acute respiratory failure, unspecified whether with hypoxia or hypercapnia: Secondary | ICD-10-CM | POA: Diagnosis present

## 2015-03-12 DIAGNOSIS — D638 Anemia in other chronic diseases classified elsewhere: Secondary | ICD-10-CM | POA: Diagnosis present

## 2015-03-12 DIAGNOSIS — R627 Adult failure to thrive: Secondary | ICD-10-CM

## 2015-03-12 DIAGNOSIS — R410 Disorientation, unspecified: Secondary | ICD-10-CM | POA: Diagnosis not present

## 2015-03-12 DIAGNOSIS — Z853 Personal history of malignant neoplasm of breast: Secondary | ICD-10-CM | POA: Diagnosis not present

## 2015-03-12 DIAGNOSIS — K219 Gastro-esophageal reflux disease without esophagitis: Secondary | ICD-10-CM | POA: Diagnosis present

## 2015-03-12 DIAGNOSIS — J9811 Atelectasis: Secondary | ICD-10-CM | POA: Diagnosis present

## 2015-03-12 DIAGNOSIS — C7800 Secondary malignant neoplasm of unspecified lung: Secondary | ICD-10-CM | POA: Diagnosis present

## 2015-03-12 DIAGNOSIS — Z515 Encounter for palliative care: Secondary | ICD-10-CM | POA: Diagnosis not present

## 2015-03-12 DIAGNOSIS — R64 Cachexia: Secondary | ICD-10-CM | POA: Diagnosis present

## 2015-03-12 DIAGNOSIS — Z803 Family history of malignant neoplasm of breast: Secondary | ICD-10-CM | POA: Diagnosis not present

## 2015-03-12 DIAGNOSIS — I517 Cardiomegaly: Secondary | ICD-10-CM | POA: Diagnosis present

## 2015-03-12 DIAGNOSIS — Z888 Allergy status to other drugs, medicaments and biological substances status: Secondary | ICD-10-CM | POA: Diagnosis not present

## 2015-03-12 DIAGNOSIS — I10 Essential (primary) hypertension: Secondary | ICD-10-CM | POA: Diagnosis present

## 2015-03-12 DIAGNOSIS — Z681 Body mass index (BMI) 19 or less, adult: Secondary | ICD-10-CM | POA: Diagnosis not present

## 2015-03-12 DIAGNOSIS — Z66 Do not resuscitate: Secondary | ICD-10-CM | POA: Diagnosis present

## 2015-03-12 DIAGNOSIS — Z9181 History of falling: Secondary | ICD-10-CM | POA: Diagnosis not present

## 2015-03-12 DIAGNOSIS — E440 Moderate protein-calorie malnutrition: Secondary | ICD-10-CM | POA: Diagnosis present

## 2015-03-12 DIAGNOSIS — J449 Chronic obstructive pulmonary disease, unspecified: Secondary | ICD-10-CM | POA: Diagnosis present

## 2015-03-12 DIAGNOSIS — Z880 Allergy status to penicillin: Secondary | ICD-10-CM | POA: Diagnosis not present

## 2015-03-12 DIAGNOSIS — I4891 Unspecified atrial fibrillation: Secondary | ICD-10-CM | POA: Diagnosis present

## 2015-03-12 DIAGNOSIS — Z85028 Personal history of other malignant neoplasm of stomach: Secondary | ICD-10-CM | POA: Diagnosis not present

## 2015-03-12 DIAGNOSIS — Z881 Allergy status to other antibiotic agents status: Secondary | ICD-10-CM | POA: Diagnosis not present

## 2015-03-12 LAB — COMPREHENSIVE METABOLIC PANEL
ALK PHOS: 122 U/L (ref 38–126)
ALT: 16 U/L (ref 14–54)
AST: 23 U/L (ref 15–41)
Albumin: 2.1 g/dL — ABNORMAL LOW (ref 3.5–5.0)
Anion gap: 8 (ref 5–15)
BUN: 30 mg/dL — AB (ref 6–20)
CALCIUM: 8 mg/dL — AB (ref 8.9–10.3)
CO2: 25 mmol/L (ref 22–32)
Chloride: 99 mmol/L — ABNORMAL LOW (ref 101–111)
Creatinine, Ser: 0.93 mg/dL (ref 0.44–1.00)
GFR calc non Af Amer: 56 mL/min — ABNORMAL LOW (ref >60)
GLUCOSE: 118 mg/dL — AB (ref 65–99)
Potassium: 4.4 mmol/L (ref 3.5–5.1)
Sodium: 132 mmol/L — ABNORMAL LOW (ref 135–145)
Total Bilirubin: 0.5 mg/dL (ref 0.3–1.2)
Total Protein: 6.4 g/dL — ABNORMAL LOW (ref 6.5–8.1)

## 2015-03-12 LAB — CREATININE, SERUM
Creatinine, Ser: 0.67 mg/dL (ref 0.44–1.00)
GFR calc Af Amer: >60 mL/min (ref >60)
GFR calc non Af Amer: >60 mL/min (ref >60)

## 2015-03-12 LAB — LIPASE, BLOOD: LIPASE: 21 U/L — AB (ref 22–51)

## 2015-03-12 LAB — CBC
HCT: 27.3 % — ABNORMAL LOW (ref 35.0–47.0)
Hemoglobin: 8.8 g/dL — ABNORMAL LOW (ref 12.0–16.0)
MCH: 29.5 pg (ref 26.0–34.0)
MCHC: 32.1 g/dL (ref 32.0–36.0)
MCV: 91.6 fL (ref 80.0–100.0)
Platelets: 246 10*3/uL (ref 150–440)
RBC: 2.97 MIL/uL — ABNORMAL LOW (ref 3.80–5.20)
RDW: 15.3 % — ABNORMAL HIGH (ref 11.5–14.5)
WBC: 10.5 10*3/uL (ref 3.6–11.0)

## 2015-03-12 LAB — PATHOLOGIST SMEAR REVIEW

## 2015-03-12 LAB — CYTOLOGY - NON PAP

## 2015-03-12 LAB — TROPONIN I: TROPONIN I: 0.04 ng/mL — AB (ref <0.031)

## 2015-03-12 MED ORDER — DIPHENHYDRAMINE HCL 25 MG PO CAPS: 25.0000 mg | ORAL_CAPSULE | Freq: Every evening | ORAL | Status: DC | PRN

## 2015-03-12 MED ORDER — VANCOMYCIN HCL 500 MG IV SOLR
500.0000 mg | INTRAVENOUS | Status: DC
  Administered 2015-03-12: 500 mg via INTRAVENOUS
  Filled 2015-03-12: qty 500

## 2015-03-12 MED ORDER — POTASSIUM CHLORIDE 20 MEQ PO PACK
20.0000 meq | PACK | Freq: Every day | ORAL | Status: DC
  Administered 2015-03-12 – 2015-03-14 (×3): 20 meq via ORAL
  Filled 2015-03-12 (×4): qty 1

## 2015-03-12 MED ORDER — ALPRAZOLAM 0.25 MG PO TABS
0.2500 mg | ORAL_TABLET | Freq: Two times a day (BID) | ORAL | Status: DC
  Administered 2015-03-12 – 2015-03-14 (×5): 0.25 mg via ORAL
  Filled 2015-03-12 (×5): qty 1

## 2015-03-12 MED ORDER — DEXTROSE 5 % IV SOLN
2.0000 g | INTRAVENOUS | Status: DC
  Administered 2015-03-12 – 2015-03-15 (×4): 2 g via INTRAVENOUS
  Filled 2015-03-12 (×5): qty 2

## 2015-03-12 MED ORDER — HEPARIN SODIUM (PORCINE) 5000 UNIT/ML IJ SOLN
5000.0000 [IU] | Freq: Three times a day (TID) | INTRAMUSCULAR | Status: DC
Start: 2015-03-12 — End: 2015-03-15
  Administered 2015-03-12 – 2015-03-15 (×10): 5000 [IU] via SUBCUTANEOUS
  Filled 2015-03-12 (×10): qty 1

## 2015-03-12 MED ORDER — DEXTROSE 5 % IV SOLN
1.0000 g | Freq: Three times a day (TID) | INTRAVENOUS | Status: DC
  Administered 2015-03-12: 1 g via INTRAVENOUS
  Filled 2015-03-12 (×4): qty 1

## 2015-03-12 MED ORDER — MORPHINE SULFATE 2 MG/ML IJ SOLN: 1.0000 mg | INTRAMUSCULAR | Status: DC | PRN

## 2015-03-12 MED ORDER — SODIUM CHLORIDE 0.9 % IJ SOLN
3.0000 mL | Freq: Two times a day (BID) | INTRAMUSCULAR | Status: DC
  Administered 2015-03-12 – 2015-03-14 (×6): 3 mL via INTRAVENOUS

## 2015-03-12 MED ORDER — AZTREONAM 1 G IJ SOLR
1.0000 g | Freq: Three times a day (TID) | INTRAMUSCULAR | Status: DC
  Filled 2015-03-12 (×4): qty 1

## 2015-03-12 MED ORDER — ONDANSETRON HCL 4 MG PO TABS: 4.0000 mg | ORAL_TABLET | Freq: Three times a day (TID) | ORAL | Status: DC | PRN

## 2015-03-12 MED ORDER — KETOROLAC TROMETHAMINE 30 MG/ML IJ SOLN: 30.0000 mg | Freq: Four times a day (QID) | INTRAMUSCULAR | Status: DC | PRN

## 2015-03-12 MED ORDER — LEVOFLOXACIN IN D5W 750 MG/150ML IV SOLN
750.0000 mg | INTRAVENOUS | Status: DC
  Administered 2015-03-14: 750 mg via INTRAVENOUS
  Filled 2015-03-12 (×2): qty 150

## 2015-03-12 MED ORDER — AZTREONAM 1 G IJ SOLR: 1.0000 g | Freq: Three times a day (TID) | INTRAMUSCULAR | Status: DC

## 2015-03-12 MED ORDER — KETOROLAC TROMETHAMINE 30 MG/ML IJ SOLN
15.0000 mg | Freq: Four times a day (QID) | INTRAMUSCULAR | Status: DC | PRN
  Administered 2015-03-13 – 2015-03-16 (×3): 15 mg via INTRAVENOUS
  Filled 2015-03-12 (×4): qty 1

## 2015-03-12 MED ORDER — LEVOFLOXACIN IN D5W 750 MG/150ML IV SOLN
750.0000 mg | Freq: Once | INTRAVENOUS | Status: AC
  Administered 2015-03-12: 750 mg via INTRAVENOUS
  Filled 2015-03-12: qty 150

## 2015-03-12 MED ORDER — MORPHINE SULFATE 2 MG/ML IJ SOLN
INTRAMUSCULAR | Status: AC
  Filled 2015-03-12: qty 1

## 2015-03-12 MED ORDER — ZOLPIDEM TARTRATE 5 MG PO TABS
5.0000 mg | ORAL_TABLET | Freq: Every evening | ORAL | Status: DC | PRN
  Administered 2015-03-12: 5 mg via ORAL
  Filled 2015-03-12: qty 1

## 2015-03-12 MED ORDER — POLYETHYLENE GLYCOL 3350 17 G PO PACK
17.0000 g | PACK | Freq: Every day | ORAL | Status: DC
  Administered 2015-03-14: 17 g via ORAL
  Filled 2015-03-12 (×2): qty 1

## 2015-03-12 MED ORDER — VANCOMYCIN HCL 500 MG IV SOLR
500.0000 mg | Freq: Once | INTRAVENOUS | Status: AC
  Administered 2015-03-12: 500 mg via INTRAVENOUS
  Filled 2015-03-12: qty 500

## 2015-03-12 MED ORDER — HYDROMORPHONE HCL 1 MG/ML IJ SOLN
0.5000 mg | INTRAMUSCULAR | Status: DC | PRN
  Administered 2015-03-12 (×2): 0.5 mg via INTRAVENOUS
  Filled 2015-03-12 (×2): qty 1

## 2015-03-12 MED ORDER — PHENAZOPYRIDINE HCL 100 MG PO TABS
100.0000 mg | ORAL_TABLET | Freq: Three times a day (TID) | ORAL | Status: DC
  Administered 2015-03-12 – 2015-03-15 (×11): 100 mg via ORAL
  Filled 2015-03-12 (×12): qty 1

## 2015-03-12 MED ORDER — ACETAMINOPHEN 650 MG RE SUPP: 650.0000 mg | Freq: Four times a day (QID) | RECTAL | Status: DC | PRN

## 2015-03-12 MED ORDER — SODIUM CHLORIDE 0.9 % IV SOLN
INTRAVENOUS | Status: DC
  Administered 2015-03-12: 18:00:00 via INTRAVENOUS

## 2015-03-12 MED ORDER — ACETAMINOPHEN 325 MG PO TABS: 650.0000 mg | ORAL_TABLET | Freq: Four times a day (QID) | ORAL | Status: DC | PRN

## 2015-03-12 MED ORDER — SERTRALINE HCL 50 MG PO TABS
50.0000 mg | ORAL_TABLET | Freq: Every day | ORAL | Status: DC
  Administered 2015-03-12 – 2015-03-14 (×3): 50 mg via ORAL
  Filled 2015-03-12 (×3): qty 1

## 2015-03-12 MED ORDER — MORPHINE SULFATE (CONCENTRATE) 10 MG/0.5ML PO SOLN: 10.0000 mg | ORAL | Status: DC | PRN

## 2015-03-12 MED ORDER — PANTOPRAZOLE SODIUM 40 MG PO TBEC
40.0000 mg | DELAYED_RELEASE_TABLET | ORAL | Status: DC
  Administered 2015-03-12 – 2015-03-13 (×2): 40 mg via ORAL
  Filled 2015-03-12 (×2): qty 1

## 2015-03-12 MED ORDER — FENTANYL 50 MCG/HR TD PT72
50.0000 ug | MEDICATED_PATCH | TRANSDERMAL | Status: DC
  Administered 2015-03-12: 50 ug via TRANSDERMAL
  Filled 2015-03-12: qty 1

## 2015-03-12 MED ORDER — MORPHINE SULFATE 2 MG/ML IJ SOLN
2.0000 mg | Freq: Once | INTRAMUSCULAR | Status: AC
  Administered 2015-03-12: 2 mg via INTRAVENOUS

## 2015-03-12 MED ORDER — HYDROMORPHONE HCL 1 MG/ML IJ SOLN
0.5000 mg | INTRAMUSCULAR | Status: DC | PRN
  Administered 2015-03-12 – 2015-03-13 (×5): 0.5 mg via INTRAVENOUS
  Filled 2015-03-12 (×6): qty 1

## 2015-03-12 MED ORDER — LISINOPRIL 5 MG PO TABS
5.0000 mg | ORAL_TABLET | Freq: Every day | ORAL | Status: DC
  Administered 2015-03-12: 5 mg via ORAL
  Filled 2015-03-12 (×2): qty 1

## 2015-03-12 MED ORDER — FLUCONAZOLE 100 MG PO TABS: 100.0000 mg | ORAL_TABLET | Freq: Every day | ORAL | Status: DC | PRN

## 2015-03-12 MED ORDER — SODIUM CHLORIDE 0.9 % IV SOLN
500.0000 mg | INTRAVENOUS | Status: DC
  Administered 2015-03-13 – 2015-03-14 (×2): 500 mg via INTRAVENOUS
  Filled 2015-03-12 (×3): qty 500

## 2015-03-12 NOTE — Care Management (Signed)
Discussed during progression to assess patient for need of home 02.  She has a room air sat of 85%.  It is not documented if it is at rest .  She is currently satting 96% on 02.  She had a thoracentesis  5/16 at [facility].  Patient presents from home and has a history of stomach cancer which is reported to be in  Remission.  She presents with fever and progressive profound weakness over the last several days.  She is experiencing left chest pain that has prevented her from being able to sleep

## 2015-03-12 NOTE — Progress Notes (Signed)
Dr. Marcille Blanco Pt. Restless and pts. Son requesting something to help his mom rest. Dr. Marcille Blanco ordered Lorrin Mais 5mg .

## 2015-03-12 NOTE — ED Notes (Signed)
Peripheral IV established instead of port accessed per family preference

## 2015-03-12 NOTE — Progress Notes (Signed)
ANTIBIOTIC CONSULT NOTE - INITIAL  Pharmacy Consult for vancomycin and Levaquin dosing Indication: sepsis/pneumonia  Allergies  Allergen Reactions  . Aspirin Nausea Only  . Clarithromycin Other (See Comments)    Reaction:  Unknown   . Propoxyphene Other (See Comments)    Reaction:  Unknown   . Penicillins Rash    Patient Measurements: Weight: 77 lb 12.8 oz (35.29 kg) Adjusted Body Weight: 35.3kg  Vital Signs: Temp: 98.6 F (37 C) (05/19 0210) Temp Source: Oral (05/19 0210) BP: 136/49 mmHg (05/19 0242) Pulse Rate: 82 (05/19 0210) Intake/Output from previous day: 05/18 0701 - 05/19 0700 In: -  Out: 250 [Urine:250] Intake/Output from this shift: Total I/O In: -  Out: 250 [Urine:250]  Labs:  Recent Labs  03/11/15 2102  WBC 12.3*  HGB 9.5*  PLT 291  CREATININE 0.93   Estimated Creatinine Clearance: 26.4 mL/min (by C-G formula based on Cr of 0.93). No results for input(s): VANCOTROUGH, VANCOPEAK, VANCORANDOM, GENTTROUGH, GENTPEAK, GENTRANDOM, TOBRATROUGH, TOBRAPEAK, TOBRARND, AMIKACINPEAK, AMIKACINTROU, AMIKACIN in the last 72 hours.   Microbiology: No results found for this or any previous visit (from the past 720 hour(s)).  Medical History: Past Medical History  Diagnosis Date  . Cancer     stomach cancer  . Breast cancer     Medications:   Assessment: Pan cx pending CXR: bilateral effusion UA: pending  Goal of Therapy:  Vancomycin trough level 15-20 mcg/ml  Plan:  TBW 35.3 kg  DW 35.3kg  Vd 25L kei 0.026 hr-1  t1/2 27 hours. 500 mg x1 given. Stacked dose of 500 mg ordered for 15:00 and then q 36 hours. Level ordered 14:00,  5/22 before 4th dose.  Levaquin 750 mg IV q 48 hours ordered.  Devereaux Grayson S 03/12/2015,4:31 AM

## 2015-03-12 NOTE — ED Notes (Signed)
MD Marcille Blanco aware of trop. MD at bedside

## 2015-03-12 NOTE — Progress Notes (Signed)
Patient is lethargic, arousable to voice although heard of hearing. Reporting improvement in left back/chest pain with PRN medication. Family at bedside attentive to patient needs. Dr Oliva Bustard and palliative care consults saw patient today. Resting quietly between care. Will continue to monitor.

## 2015-03-12 NOTE — ED Notes (Signed)
Attempted to call report, stated to call back in 10 minutes.

## 2015-03-12 NOTE — ED Notes (Signed)
Calling report

## 2015-03-12 NOTE — Progress Notes (Signed)
[facility] PROGRESS NOTE  KARITA DRALLE HGD:[id number] DOB: 1931/10/29 DOA: 03/11/2015 PCP: Sofie Hartigan, Michelle  HPI/Subjective: Patient having a lot of chest pain. Especially when she moves. Has not slept in 3 days secondary to pain. Had a recent thoracentesis. I was called early this morning by nursing to clarify a lot of things with the patient and son at the bedside. No concern about CODE STATUS. The patient is a DO NOT RESUSCITATE. They wanted Dr. Oliva Bustard to come see her.  Objective: Filed Vitals:   03/12/15 0524  BP: 139/59  Pulse: 91  Temp: 97.9 F (36.6 C)  Resp: 19    Intake/Output Summary (Last 24 hours) at 03/12/15 0802 Last data filed at 03/12/15 0800  Gross per 24 hour  Intake    100 ml  Output    250 ml  Net   -150 ml   Filed Weights   03/11/15 2042 03/12/15 0649  Weight: 35.29 kg (77 lb 12.8 oz) 37.43 kg (82 lb 8.3 oz)    ROS: Review of Systems  Constitutional: Positive for fever and weight loss. Negative for chills.  Eyes: Negative for blurred vision.  Respiratory: Positive for cough, sputum production and shortness of breath.   Cardiovascular: Positive for chest pain.  Gastrointestinal: Positive for nausea and abdominal pain. Negative for vomiting, diarrhea and constipation.  Genitourinary: Negative for dysuria.  Musculoskeletal: Negative for joint pain.  Neurological: Negative for dizziness and headaches.   Exam: Physical Exam  Constitutional: She appears cachectic.  HENT:  Nose: No mucosal edema.  Mouth/Throat: No oropharyngeal exudate or posterior oropharyngeal edema.  Blood tinged dried from the right nostril. Thrush seen on tongue.  Eyes: Conjunctivae, EOM and lids are normal. Pupils are equal, round, and reactive to light.  Neck: No JVD present. Carotid bruit is not present. No edema present. No thyroid mass and no thyromegaly present.  Cardiovascular: S1 normal and S2 normal.  Exam reveals no gallop.   No murmur  heard. Pulses:      Dorsalis pedis pulses are 2+ on the right side, and 2+ on the left side.  Respiratory: No respiratory distress. She has decreased breath sounds in the right lower field and the left lower field. She has no wheezes. She has rhonchi in the right lower field and the left lower field. She has no rales.  GI: Soft. Bowel sounds are normal. There is tenderness in the epigastric area.  Lymphadenopathy:    She has no cervical adenopathy.  Neurological: She is alert. No cranial nerve deficit.  Skin: Skin is warm. No rash noted. Nails show no clubbing.  Psychiatric: She has a normal mood and affect.   Data Reviewed: Basic Metabolic Panel:  Recent Labs Lab 03/11/15 2102  NA 132*  K 4.4  CL 99*  CO2 25  GLUCOSE 118*  BUN 30*  CREATININE 0.93  CALCIUM 8.0*   Liver Function Tests:  Recent Labs Lab 03/11/15 2102  AST 23  ALT 16  ALKPHOS 122  BILITOT 0.5  PROT 6.4*  ALBUMIN 2.1*    Recent Labs Lab 03/11/15 2102  LIPASE 21*    CBC:  Recent Labs Lab 03/11/15 2102  WBC 12.3*  NEUTROABS 10.8*  HGB 9.5*  HCT 28.7*  MCV 92.1  PLT 291   Cardiac Enzymes:  Recent Labs Lab 03/11/15 2102  TROPONINI 0.04*   Studies: Dg Chest Port 1 View  03/11/2015   CLINICAL DATA:  Weakness, confusion, fever and difficulty breathing. Gastric cancer with  pleural effusions. Post thoracentesis 2 days prior.  EXAM: PORTABLE CHEST - 1 VIEW  COMPARISON:  Chest radiograph 03/09/2015  FINDINGS: Tip of the right chest port in the mid SVC. Lower lung volumes from prior. Cardiomegaly is unchanged allowing for differences in technique. Bilateral pleural effusions with bibasilar airspace disease, left greater than right. This is unchanged from prior exam. There is no pneumothorax. No pulmonary edema.  IMPRESSION: Lower lung volumes with unchanged bilateral pleural effusions and associated bibasilar airspace disease. No pneumothorax or significant change from prior.   Electronically Signed    By: Jeb Levering M.D.   On: 03/11/2015 21:36    Scheduled Meds: . ALPRAZolam  0.25 mg Oral BID  . aztreonam  1 g Intramuscular 3 times per day  . fentaNYL  50 mcg Transdermal Q72H  . heparin  5,000 Units Subcutaneous 3 times per day  . [START ON 03/14/2015] levofloxacin (LEVAQUIN) IV  750 mg Intravenous Q48H  . lisinopril  5 mg Oral Daily  . morphine      . pantoprazole  40 mg Oral [id number]  . phenazopyridine  100 mg Oral TID  . potassium chloride  20 mEq Oral Daily  . sertraline  50 mg Oral Daily  . sodium chloride  3 mL Intravenous Q12H  . vancomycin  500 mg Intravenous Q36H   Continuous Infusions:   Assessment/Plan: Active Problems:   Pneumonia   1. Clinical sepsis, bibasilar pneumonia, patient is compromised with history of cancer. Patient is on triple antibiotics with vancomycin and Levaquin and aztreonam. Patient had a recent thoracentesis. Patient did not want to get a CT scan today for further evaluation of the lungs because she would rather rest today. 2. Chest pain: I will start Roxanol, change IV morphine to IV dilaudid, continue fentanyl patch. Palliative care consultation for pain management. Family does not want to make a decision on hospice home at this point. 3. 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 Family requests Dr. Oliva Bustard to see the patient. 4. Gastroesophageal reflux disease without esophagitis on Protonix 5. Hypertension essential on lisinopril. 6. Depression on low dose Zoloft. 7. CODE STATUS changed to DO NOT RESUSCITATE.  Code Status:     Code Status Orders        Start     Ordered   03/12/15 0758  Do not attempt resuscitation (DNR)   Continuous    Question Answer Comment  In the event of cardiac or respiratory ARREST Do not call a "code blue"   In the event of cardiac or respiratory ARREST Do not perform Intubation, CPR, defibrillation or ACLS   In the event of cardiac or respiratory ARREST Use medication by any route,  position, wound care, and other measures to relive pain and suffering. May use oxygen, suction and manual treatment of airway obstruction as needed for comfort.   Comments nurse may pronounce      03/12/15 0758    Advance Directive Documentation        Most Recent Value   Type of Advance Directive  Healthcare Power of Attorney   Pre-existing out of facility DNR order (yellow form or pink MOST form)     "MOST" Form in Place?       Family Communication: Son at bedside  Disposition Plan: To be determined  Consultants:  Palliative care  Oncology  Time spe35 minutes  Loletha Grayer  [facility]

## 2015-03-12 NOTE — Progress Notes (Signed)
Pt. Alert but confused at times. VSS. Pain in back relieved a little with morphine but seemed to make pt. Confused and nervous. Pt. Receiving IV antibiotics through SL in left arm. NPO. Port in right chest not accessed per request of pt. Resting quietly at this time with son at the bedside.

## 2015-03-12 NOTE — Consult Note (Signed)
Chief Complaint/Diagnosis:   1. Anemia normocytic, normochromic. 2. Vitamin B 12 deficiency. Iron deficiency. 3. Carcinoma of stomach invasive adenocarcinoma.  Diagnosed by upper endoscopy in August of 2012. based on EUS. and a PET scan patient has T3, N2, M1, disease, stage IV 4. Carboplatinum and Taxol,started in September of 2012. HER-2/neu receptor by IHC is 3+. 5. Herceptin added from October of 2012. 6. Herceptin has been put on hold to give  patient some chemotherapy holidays.November of 2014 7. Recent upper endoscopy as well as CT scan shows no evidence of malignancy(January, 20124) HPI:   81 year old lady was admitted in the hospital with increasing confusion.  Leukocytosis.  Patient recently had thoracentesis and the fluid was negative for malignant cells but protein was high and there was high neutrophilic infiltrate suggesting infection.  Patient continues to some difficulty swallowing.  Patient was increasingly confused disoriented as increasing chest pain.  In view of fall this finding is was admitted in the hospital for further evaluation and treatment consideration.  I was asked to evaluate patient at that point in time   Review of Systems:  General: weakness  Fatigue Recent is getting more and more confused and disoriented with frequent fall   Performance Status (ECOG): 2  HEENT: no complaints  Lungs: Increasing chest pain.  Increasing shortness of breath hypoxia and a recent thoracentesis.    Cardiac: Increasing shortness of breath   GU: no complaints  Musculoskeletal: no complaints  Extremities: no complaints  Skin: no complaints  Neuro: Patient is increasing confusion and disorientation had evaluation by neurologist and CT scan of brain was noncontributory   Psych: anxiety  depression  Pain: Persistent chest pain in the left side.    Review of Systems: All other systems were reviewed and found to be negative    GI: Poor appetite.  Increasing difficulty swallowing.   Possible aspiration.  Allergies:  Penicillin: N/V/Diarrhea, GI Distress  Darvon: Hallucinations  Biaxin: Swelling  Aspirin: GI Distress  Significant History/PMH:   Previous blood transfusion:    Anemia:    Heart Murmer:    Stomach Cancer:    COPD:    GERD - Esophageal Reflux:    Hypertension:    Breast Cancer: 40 years ago   Adenocarcinoma: stomach, Aug 2012   Dental Implants:    Oral Surgery: Multiple. Lower jaw replacement.   Mastectomy, bilateral:    hysterectomy and bladder tac:   Smoking History: Smoking History Never Smoked.(1)  PFSH: Comments: Significant family history of ovarian cancer and of breast cancer and colon cancer  Comments: Patient does not smoke does not drink  Additional Past Medical and Surgical History: History of breast cancer 30 years ago.  Had bilateral mastectomy.  No chemotherapy or radiation treatment.  osteo arthritis  Aortic valve  disorder  Rheumatoid fever in childhood  Depression   COPD   Esophageal dilatation   Facial reconstruction in 1994   Home Medications: Medication Instructions Last Modified Date/Time  fentaNYL 25 mcg/hr transdermal film, extended release 1 patch transdermal every 72 hours 28-Apr-16 16:07  CeleXA 20 mg oral tablet 1 tab(s) orally once a day 28-Apr-16 15:14  potassium chloride 20 mEq oral powder for reconstitution 1 each orally once a day 28-Apr-16 15:14  LORazepam 1 mg oral tablet 1 tab(s) orally once 1 hour prior to MRI 28-Apr-16 15:14  Diflucan 100 mg tablet 1 tab(s) orally once a day x 7 days 28-Apr-16 15:14  guaiFENesin 600 mg oral tablet, extended release 1 tab(s) orally every 12  hours 28-Apr-16 15:14  ondansetron 4 mg oral tablet, disintegrating 1 tab(s) orally 3 times a day, As Needed  28-Apr-16 15:14  gabapentin 100 mg oral capsule 2 cap(s) orally 2 times a day 28-Apr-16 15:14  lisinopril 5 mg oral tablet 1 tab(s) orally once a day 28-Apr-16 15:14  ALPRAZolam 0.25 mg oral tablet 1 tab(s)  orally 3 times a day, As Needed - for Anxiety, Nervousness 28-Apr-16 15:14  K-Sol 40 mEq/15 mL oral liquid 15 milliliter(s) orally once, take with dinner tonight 28-Apr-16 15:14  clotrimazole 1% 1 applicatorful vaginal once a day (at bedtime), As Needed 28-Apr-16 15:14  promethazine 25 mg rectal suppository 1 SUPP(s) rectal every 6 hours, As Needed 28-Apr-16 15:14  Tylenol 500 mg oral tablet 1 tab(s) orally every 6 hours, As Needed - for Pain 28-Apr-16 15:14  diphenhydrAMINE 25 mg oral capsule 1 cap(s) orally once a day (at bedtime), As Needed 28-Apr-16 15:14  omeprazole 20 mg oral delayed release capsule 1 cap(s) orally 2 times a day 28-Apr-16 15:14  Colace 100 mg oral capsule 1 cap(s) orally 2 times a day 28-Apr-16 15:14   Vital Signs:  :: Ht(CM): 152.4 Wt(KG): 35.7 BSA: 1.2 Temp: 98.5 Pulse: 71 RR: 18  BP: 175/69   Physical Exam:  General: cachectic patient was in mild distress because of pain   Mental Status: apprehensive  Eyes: pupils  are equal reacting to light.    No jaundice  palelooking sclera  Head, Ears, Nose,Throat: normal, no lesions or deformities  Neck, Thyroid: no thyroid tenderness, enlargement or nodule.  neck supple without massess or tenderness. no adenopathy.  Respiratory: lungs: Diminished air  entry.  no  rhonchi.  No rales.  Cardiovascular: ttachycardia  Gastrointestinal: abdomen is soft.  Liver and spleen not palpable.  No ascites.  No tenderness.  Bowel sounds are normal  Musculoskeletal: no muscular asymmetry noted.  no swelling or tenderness of joints.  ROM upper and lower extremities normal  Skin: no rash.  No ecchymosis.no skin lesions.  Neurological: Patient is disoriented but no focal signs  cranial nerves are intact  Muscle power tone within normal limit.  No focal signs.  No sensory deficit.  Lymphatics: no cervical, axillary, or inguinal lymphadenopathy  Lab data has been reviewed leucocytrosis , Anemia.  Chest x-ray shows bilateral small pleural  effusion which has been reviewed independently  c   Impression and plan 1.  Clinical picture is more suggestive of pulmonary infection possibility of aspiration pneumonia cannot be ruled out and agree with IV antibiotics. 2.  Cachexia and malnutrition due to multiple factors possibility of swallowing difficulty causing aspiration cannot be ruled out 3.  Previous history of carcinoma of stomach possibility of recurrent disease cannot be ruled out   Plan Continue IV antibiotics  Consider peripheral hyper alimetation. Swallowing study to rule out aspiration Pleural fluid was negative for cytology but had neutrophilic infiltrate.  Suggestive of possibility of underlying pneumonia which very well could be aspiration pneumonia Had prolonged discussion with patient as well as with palliative care team our goal is to control pain at present time consider IV antibiotics and continue antibiotics at present time he patient's condition does not improve in next 72 hours may need to consider discussion regarding putting her through bronchoscopy or pleural biopsy and in that case discussion regarding palliative care approach considering overall patient's age and slowly declining condition is to be considered. This has been discussed with the patient 's family

## 2015-03-12 NOTE — Consult Note (Signed)
Palliative Medicine Inpatient Consult Note   Name: Michelle Webb Date: 03/12/2015 MRN: [id number]  DOB: 10/08/1932  Referring Physician: Loletha Grayer, MD  Palliative Care consult requested for this 81 y.o. female for goals of medical therapy in patient with gastric cancer, h/o breast cancer (40 years ago), s/p bilateral mastectomies, COPD, GERD, and depression. Pt presented to ER from home with SOB, confusion and back pain.  ER workup significant for bilat pleural effusions, PNA?, and leukocytosis.  Pt with recent thoracentesis and cytology is pending.  Pt is currently sitting at bedside commode complaining of back pain.  Son at bedside.    REVIEW OF SYSTEMS:  Pain: Moderate Dyspnea:  No Anxiety:   Yes Fatigue:   Yes All other systems were reviewed and found to be negative  SPIRITUAL SUPPORT SYSTEM: Yes.  SOCIAL HISTORY:  reports that she has never smoked. She has never used smokeless tobacco. She reports that she does not drink alcohol or use illicit drugs.  Pt lives at home with son Legrand Como.  Pt is widowed.  Pt has three other children.  LEGAL DOCUMENTS:  Advance Directives:  No, information provided. Health Care Power of Attorney:  No, information provided.  CODE STATUS: DNR  PAST MEDICAL HISTORY: Past Medical History  Diagnosis Date  . Cancer     stomach cancer  . Breast cancer     PAST SURGICAL HISTORY:  Past Surgical History  Procedure Laterality Date  . Mastectomy Bilateral   . Abdominal hysterectomy      ALLERGIES:  is allergic to aspirin; clarithromycin; propoxyphene; and penicillins.  MEDICATIONS:  Current Facility-Administered Medications  Medication Dose Route Frequency Provider Last Rate Last Dose  . acetaminophen (TYLENOL) tablet 650 mg  650 mg Oral Q6H PRN Harrie Foreman, MD       Or  . acetaminophen (TYLENOL) suppository 650 mg  650 mg Rectal Q6H PRN Harrie Foreman, MD      . ALPRAZolam Duanne Moron) tablet 0.25 mg  0.25 mg Oral BID Harrie Foreman, MD   0.25 mg at 03/12/15 4008  . aztreonam (AZACTAM) injection 1 g  1 g Intramuscular 3 times per day Loletha Grayer, MD      . diphenhydrAMINE (BENADRYL) capsule 25 mg  25 mg Oral QHS PRN Harrie Foreman, MD      . fentaNYL ([place] - dosed mcg/hr) 50 mcg  50 mcg Transdermal Q72H Harrie Foreman, MD   50 mcg at 03/12/15 6761  . fluconazole (DIFLUCAN) tablet 100 mg  100 mg Oral Daily PRN Harrie Foreman, MD      . heparin injection 5,000 Units  5,000 Units Subcutaneous 3 times per day Harrie Foreman, MD   5,000 Units at 03/12/15 [phone]  . HYDROmorphone (DILAUDID) injection 0.5 mg  0.5 mg Intravenous Q3H PRN Loletha Grayer, MD   0.5 mg at 03/12/15 0856  . [START ON 03/14/2015] levofloxacin (LEVAQUIN) IVPB 750 mg  750 mg Intravenous Q48H Harrie Foreman, MD      . lisinopril (PRINIVIL,ZESTRIL) tablet 5 mg  5 mg Oral Daily Harrie Foreman, MD      . morphine 2 MG/ML injection           . morphine CONCENTRATE 10 MG/0.5ML oral solution 10 mg  10 mg Oral Q1H PRN Loletha Grayer, MD      . ondansetron [facility]) tablet 4 mg  4 mg Oral Q8H PRN Harrie Foreman, MD      . pantoprazole (PROTONIX) EC  tablet 40 mg  40 mg Oral Edwinna Areola, MD   40 mg at 03/12/15 3664  . phenazopyridine (PYRIDIUM) tablet 100 mg  100 mg Oral TID Harrie Foreman, MD      . potassium chloride (KLOR-CON) packet 20 mEq  20 mEq Oral Daily Harrie Foreman, MD      . sertraline (ZOLOFT) tablet 50 mg  50 mg Oral Daily Harrie Foreman, MD      . sodium chloride 0.9 % injection 3 mL  3 mL Intravenous Q12H Harrie Foreman, MD      . vancomycin (VANCOCIN) 500 mg in sodium chloride 0.9 % 100 mL IVPB  500 mg Intravenous Q36H Harrie Foreman, MD      . zolpidem [facility]) tablet 5 mg  5 mg Oral QHS PRN Harrie Foreman, MD   5 mg at 03/12/15 0445    Vital Signs: BP 139/59 mmHg  Pulse 91  Temp(Src) 97.9 F (36.6 C) (Oral)  Resp 19  Wt 37.43 kg (82 lb 8.3 oz)  SpO2 93% Filed Weights    03/11/15 2042 03/12/15 0649  Weight: 35.29 kg (77 lb 12.8 oz) 37.43 kg (82 lb 8.3 oz)    Estimated body mass index is 16.12 kg/(m^2) as calculated from the following:   Height as of 03/09/15: 5' (1.524 m).   Weight as of this encounter: 37.43 kg (82 lb 8.3 oz).  PERFORMANCE STATUS (ECOG) : 3 - Symptomatic, >50% confined to bed  PHYSICAL EXAM: Generall: cachectic HEENT: OP clear, moist oral mucosa Neck: Trachea midline  Cardiovascular: RRR Pulmonary/Chest: diminished and rales noted to ant fields Abdominal: Soft, NTTP. Hypoactive bowel sounds GU: No SP tenderness, No CVA tenderness Extremities: No edema  Neurological: Grossly nonfocal Skin: Warm, dry and intact.  Psychiatric: Calm, A&O x 3  LABS: CBC:    Component Value Date/Time   WBC 10.5 03/12/2015 0820   WBC 6.6 02/19/2015 1410   HGB 8.8* 03/12/2015 0820   HGB 10.2* 02/19/2015 1410   HCT 27.3* 03/12/2015 0820   HCT 30.4* 02/19/2015 1410   PLT 246 03/12/2015 0820   PLT 183 02/19/2015 1410   MCV 91.6 03/12/2015 0820   MCV 94 02/19/2015 1410   NEUTROABS 10.8* 03/11/2015 2102   NEUTROABS 5.7 02/19/2015 1410   LYMPHSABS 0.6* 03/11/2015 2102   LYMPHSABS 0.4* 02/19/2015 1410   MONOABS 0.9 03/11/2015 2102   MONOABS 0.4 02/19/2015 1410   EOSABS 0.0 03/11/2015 2102   EOSABS 0.0 02/19/2015 1410   BASOSABS 0.0 03/11/2015 2102   BASOSABS 0.0 02/19/2015 1410   Comprehensive Metabolic Panel:    Component Value Date/Time   NA 132* 03/11/2015 2102   NA 132* 02/19/2015 1410   K 4.4 03/11/2015 2102   K 2.6* 02/19/2015 1410   CL 99* 03/11/2015 2102   CL 97* 02/19/2015 1410   CO2 25 03/11/2015 2102   CO2 28 02/19/2015 1410   BUN 30* 03/11/2015 2102   BUN 15 02/19/2015 1410   CREATININE 0.67 03/12/2015 0820   CREATININE 0.82 02/19/2015 1410   GLUCOSE 118* 03/11/2015 2102   GLUCOSE 123* 02/19/2015 1410   CALCIUM 8.0* 03/11/2015 2102   CALCIUM 7.9* 02/19/2015 1410   AST 23 03/11/2015 2102   AST 40 02/19/2015 1410   ALT  16 03/11/2015 2102   ALT 19 02/19/2015 1410   ALKPHOS 122 03/11/2015 2102   ALKPHOS 88 02/19/2015 1410   BILITOT 0.5 03/11/2015 2102   PROT 6.4* 03/11/2015 2102  PROT 6.5 02/19/2015 1410   ALBUMIN 2.1* 03/11/2015 2102   ALBUMIN 3.0* 02/19/2015 1410    IMPRESSION: Palliative Care consult requested for this 81 y.o. female for goals of medical therapy in patient with gastric cancer, h/o breast cancer (40 years ago), s/p bilateral mastectomies, COPD, GERD, and depression. Pt presented to ER from home with SOB, confusion and back pain.  ER workup significant for bilat pleural effusions, PNA?, and leukocytosis.  Pt with recent thoracentesis and cytology is pending.   Pt now transferred to bed and states pain to mid back.  Pt and son state morphine given this morning did not help.  Explained that pt was written for dilaudid 0.5mg  Q3h prn and other options for pain management including toradol.  When asked about side effects I explained GI bleed was one and son states pt has never had a problem with this despite her gastric cancer.  Son would be willing to see if it helps despite potential side effects. However, son would like to try dilaudid first before adminstering toradol.   Pt currently on 21mcg fentanyl pain at home with tylenol as needed.  Son states when pt is lowered from her fentanyl dose her pain increases.  Would like to keep same and increase if needed.  Will reasses based on narcotic prn needs.  Spoke with pt's oncologist, Dr. Oliva Bustard, this am.   He feels pain is related to inflammation and would want pt to try toradol with transition to mobic if toradol helps.  Son expresses wish for HCPOA if pt agrees.  Will address after pain is controlled.   PLAN: 1. dialudid dose now 2. Will order toradol 30mg  IV Q6h prn, and reassess     More than 50% of the visit was spent in counseling/coordination of care: Yes  Time Spent: 85 minutes

## 2015-03-12 NOTE — H&P (Addendum)
Michelle Webb is an 81 y.o. female.   Chief Complaint: Shortness of breath HPI: The patient presents emergency department with confusion and shortness of breath. The patient's son states that she became very confused this afternoon and progressively weaker. She had to be carried to the car to transport to the hospital. In the emergency department she was found to be hypoxic. The patient also complained of chest pain over her left chest specifically in the area where she had undergone thoracentesis 2 days prior. Chest x-ray revealed left side effusion as well as stable bibasilar atelectasis. The patient's oxygen saturations improved with supplemental oxygen via nasal cannula. Her heart rate was very elevated and she was found to have a leukocytosis which prompted the emergency department staff to call for admission.  Past Medical History  Diagnosis Date  . Cancer     stomach cancer  . Breast cancer     Past Surgical History  Procedure Laterality Date  . Mastectomy Bilateral   . Abdominal hysterectomy      Family History  Problem Relation Age of Onset  . Breast cancer Sister   . Ovarian cancer Mother   . Breast cancer Daughter    Social History:  reports that she has never smoked. She has never used smokeless tobacco. She reports that she does not drink alcohol or use illicit drugs.  Allergies:  Allergies  Allergen Reactions  . Aspirin Nausea Only  . Clarithromycin Other (See Comments)    Reaction:  Unknown   . Propoxyphene Other (See Comments)    Reaction:  Unknown   . Penicillins Rash    Prior to Admission medications   Medication Sig Start Date End Date Taking? Authorizing Provider  ALPRAZolam (XANAX) 0.25 MG tablet Take 0.25 mg by mouth 2 (two) times daily.    Yes Historical Provider, MD  ciprofloxacin (CIPRO) 250 MG tablet Take 1 tablet (250 mg total) by mouth 2 (two) times daily. 03/04/15  Yes Forest Gleason, MD  diphenhydrAMINE (BENADRYL) 25 mg capsule Take 25 mg by mouth at  bedtime as needed for sleep.   Yes Historical Provider, MD  fentaNYL (DURAGESIC - DOSED MCG/HR) 50 MCG/HR Place 1 patch (50 mcg total) onto the skin every 3 (three) days. 03/03/15  Yes Forest Gleason, MD  fluconazole (DIFLUCAN) 100 MG tablet Take 100 mg by mouth daily as needed (for yeast infections).  03/11/2015  Yes Historical Provider, MD  lisinopril (PRINIVIL,ZESTRIL) 5 MG tablet Take 5 mg by mouth daily.  03/07/2015  Yes Historical Provider, MD  omeprazole (PRILOSEC) 20 MG capsule Take 20 mg by mouth 2 (two) times daily.  05/23/14  Yes Historical Provider, MD  ondansetron (ZOFRAN) 4 MG tablet Take 4 mg by mouth every 8 (eight) hours as needed for nausea or vomiting.    Yes Historical Provider, MD  phenazopyridine (PYRIDIUM) 100 MG tablet Take 1 tablet (100 mg total) by mouth 3 (three) times daily. 03/04/15  Yes Forest Gleason, MD  potassium chloride (KLOR-CON) 20 MEQ packet Take 20 mEq by mouth once. Patient taking differently: Take 20 mEq by mouth daily.  03/10/2015  Yes Forest Gleason, MD  sertraline (ZOLOFT) 50 MG tablet Take 50 mg by mouth daily.  04/14/14  Yes Historical Provider, MD     Results for orders placed or performed during the hospital encounter of 03/11/15 (from the past 48 hour(s))  APTT     Status: Abnormal   Collection Time: 03/11/15  9:02 PM  Result Value Ref Range  aPTT 46 (H) 24 - 36 seconds  CBC WITH DIFFERENTIAL     Status: Abnormal   Collection Time: 03/11/15  9:02 PM  Result Value Ref Range   WBC 12.3 (H) 3.6 - 11.0 K/uL   RBC 3.12 (L) 3.80 - 5.20 MIL/uL   Hemoglobin 9.5 (L) 12.0 - 16.0 g/dL   HCT 28.7 (L) 35.0 - 47.0 %   MCV 92.1 80.0 - 100.0 fL   MCH 30.4 26.0 - 34.0 pg   MCHC 33.0 32.0 - 36.0 g/dL   RDW 15.5 (H) 11.5 - 14.5 %   Platelets 291 150 - 440 K/uL   Neutrophils Relative % 88 %   Neutro Abs 10.8 (H) 1.4 - 6.5 K/uL   Lymphocytes Relative 5 %   Lymphs Abs 0.6 (L) 1.0 - 3.6 K/uL   Monocytes Relative 7 %   Monocytes Absolute 0.9 0.2 - 0.9 K/uL   Eosinophils  Relative 0 %   Eosinophils Absolute 0.0 0 - 0.7 K/uL   Basophils Relative 0 %   Basophils Absolute 0.0 0 - 0.1 K/uL  Comprehensive metabolic panel     Status: Abnormal   Collection Time: 03/11/15  9:02 PM  Result Value Ref Range   Sodium 132 (L) 135 - 145 mmol/L   Potassium 4.4 3.5 - 5.1 mmol/L   Chloride 99 (L) 101 - 111 mmol/L   CO2 25 22 - 32 mmol/L   Glucose, Bld 118 (H) 65 - 99 mg/dL   BUN 30 (H) 6 - 20 mg/dL   Creatinine, Ser 0.93 0.44 - 1.00 mg/dL   Calcium 8.0 (L) 8.9 - 10.3 mg/dL   Total Protein 6.4 (L) 6.5 - 8.1 g/dL   Albumin 2.1 (L) 3.5 - 5.0 g/dL   AST 23 15 - 41 U/L   ALT 16 14 - 54 U/L   Alkaline Phosphatase 122 38 - 126 U/L   Total Bilirubin 0.5 0.3 - 1.2 mg/dL   GFR calc non Af Amer 56 (L) >60 mL/min   GFR calc Af Amer >60 >60 mL/min    Comment: (NOTE) The eGFR has been calculated using the CKD EPI equation. This calculation has not been validated in all clinical situations. eGFR's persistently <60 mL/min signify possible Chronic Kidney Disease.    Anion gap 8 5 - 15  Troponin I     Status: Abnormal   Collection Time: 03/11/15  9:02 PM  Result Value Ref Range   Troponin I 0.04 (H) <0.031 ng/mL    Comment: READ BACK AND VERIFIED WITH LAURA CONNER @ 0018 5.19.16 MPG        PERSISTENTLY INCREASED TROPONIN VALUES IN THE RANGE OF 0.04-0.49 ng/mL CAN BE SEEN IN:       -UNSTABLE ANGINA       -CONGESTIVE HEART FAILURE       -MYOCARDITIS       -CHEST TRAUMA       -ARRYHTHMIAS       -LATE PRESENTING MYOCARDIAL INFARCTION       -COPD   CLINICAL FOLLOW-UP RECOMMENDED.   Lactic acid, plasma     Status: None   Collection Time: 03/11/15  9:02 PM  Result Value Ref Range   Lactic Acid, Venous 1.3 0.5 - 2.0 mmol/L  Lipase, blood     Status: Abnormal   Collection Time: 03/11/15  9:02 PM  Result Value Ref Range   Lipase 21 (L) 22 - 51 U/L  Protime-INR     Status: None   Collection Time:  03/11/15  9:02 PM  Result Value Ref Range   Prothrombin Time 14.6 11.4 -  15.0 seconds   INR 1.12    Dg Chest Port 1 View  03/11/2015   CLINICAL DATA:  Weakness, confusion, fever and difficulty breathing. Gastric cancer with pleural effusions. Post thoracentesis 2 days prior.  EXAM: PORTABLE CHEST - 1 VIEW  COMPARISON:  Chest radiograph 03/09/2015  FINDINGS: Tip of the right chest port in the mid SVC. Lower lung volumes from prior. Cardiomegaly is unchanged allowing for differences in technique. Bilateral pleural effusions with bibasilar airspace disease, left greater than right. This is unchanged from prior exam. There is no pneumothorax. No pulmonary edema.  IMPRESSION: Lower lung volumes with unchanged bilateral pleural effusions and associated bibasilar airspace disease. No pneumothorax or significant change from prior.   Electronically Signed   By: Jeb Levering M.D.   On: 03/11/2015 21:36    Review of Systems  Constitutional: Negative for fever and chills.  HENT: Negative for sore throat and tinnitus.   Eyes: Negative for blurred vision and redness.  Respiratory: Negative for cough and shortness of breath.   Cardiovascular: Negative for chest pain, palpitations, orthopnea and PND.  Gastrointestinal: Negative for nausea, vomiting, abdominal pain and diarrhea.  Genitourinary: Negative for dysuria, urgency and frequency.  Musculoskeletal: Negative for myalgias and joint pain.  Skin: Negative for rash.       No lesions  Neurological: Negative for speech change, focal weakness and weakness.  Endo/Heme/Allergies: Does not bruise/bleed easily.       No temperature intolerance  Psychiatric/Behavioral: Negative for depression and suicidal ideas.    Blood pressure 131/61, pulse 87, temperature 99.1 F (37.3 C), temperature source Oral, resp. rate 18, weight 35.29 kg (77 lb 12.8 oz), SpO2 97 %. Physical Exam  Vitals reviewed. Constitutional: She is oriented to person, place, and time. She appears well-developed and well-nourished.  HENT:  Head: Normocephalic  and atraumatic.  Eyes: EOM are normal. Pupils are equal, round, and reactive to light.  Neck: Normal range of motion. Neck supple. No JVD present. No tracheal deviation present. No thyromegaly present.  Cardiovascular: Normal rate, regular rhythm and normal heart sounds.  Exam reveals no gallop and no friction rub.   No murmur heard. Respiratory: Effort normal and breath sounds normal.  S/p b/l mastectomy Port in place R chest  GI: Soft. Bowel sounds are normal. She exhibits no distension. There is no tenderness.  Lymphadenopathy:    She has no cervical adenopathy.  Neurological: She is alert and oriented to person, place, and time. No cranial nerve deficit. She exhibits normal muscle tone.  Skin: Skin is warm and dry.  Psychiatric: She has a normal mood and affect. Judgment and thought content normal.     Assessment/Plan This is an 81 year old female admitted for healthcare associated pneumonia. 1. Pneumonia: The patient is cachectic and hypoxic with leukocytosis which clinically meets criteria for pneumonia. Consolidation is not specifically seen on chest x-ray although could be underlying the effusion that is still visible. The left-sided pleural effusion may have reaccumulated and is likely exudative from possible infiltrate. We will place the patient on IV antibiotics and supply supplemental oxygen as necessary. She has recently undergone a thoracentesis, thus we will treat for healthcare associated pneumonia. 2. Sepsis: The patient meets criteria via leukocytosis and tachypnea. Blood cultures have been obtained in the emergency department and I have ordered vancomycin and Levaquin. We will follow blood cultures and adjust antibiotic coverage per sensitivities. 3. Pleural  effusion: Likely secondary to possible infection. I reviewed the patient's fluid studies as well as recent PET scan and head CT which showed no evidence of malignancy. The patient has not undergone any radiation and her  most recent chemotherapy was nearly 2 years ago. I do not hear diminished breath sounds. The patient is likely breathing more comfortably as she is now sitting up in bed. 4. Malnutrition: Moderate. The patient has trouble eating due to abdominal pain likely secondary to inflammation in her stomach due to history of gastric cancer. We will try to supplement her diet with protein and calories to help maintain weight and strength. 5. Stomach cancer: Appears to be in remission. Oncology consultation at the discretion of the primary team. 6. DVT prophylaxis: Heparin 7. GI prophylaxis: Proton pump inhibitor per the patient's home regimen due to history of gastric cancer The patient is a full code. Time spent on admission orders and patient care approximately 45 minutes  Harrie Foreman 03/12/2015, 1:42 AM

## 2015-03-12 NOTE — Progress Notes (Signed)
ANTIBIOTIC CONSULT NOTE - FOLLOW UP  Pharmacy Consult for Cefepime, Levofloxacin, Vancomycin  Indication: pneumonia  Allergies  Allergen Reactions  . Aspirin Nausea Only  . Clarithromycin Other (See Comments)    Reaction:  Unknown   . Propoxyphene Other (See Comments)    Reaction:  Unknown   . Penicillins Rash    Patient Measurements: Weight: 82 lb 8.3 oz (37.43 kg)  Vital Signs: Temp: 99.1 F (37.3 C) (05/19 1132) Temp Source: Oral (05/19 1132) BP: 121/49 mmHg (05/19 1132) Pulse Rate: 102 (05/19 1132) Intake/Output from previous day: 05/18 0701 - 05/19 0700 In: 100 [IV Piggyback:100] Out: 250 [Urine:250] Intake/Output from this shift:    Labs:  Recent Labs  03/11/15 2102 03/12/15 0820  WBC 12.3* 10.5  HGB 9.5* 8.8*  PLT 291 246  CREATININE 0.93 0.67   Estimated Creatinine Clearance: 32.6 mL/min (by C-G formula based on Cr of 0.67). No results for input(s): VANCOTROUGH, VANCOPEAK, VANCORANDOM, GENTTROUGH, GENTPEAK, GENTRANDOM, TOBRATROUGH, TOBRAPEAK, TOBRARND, AMIKACINPEAK, AMIKACINTROU, AMIKACIN in the last 72 hours.   Microbiology: Recent Results (from the past 720 hour(s))  Blood Culture (routine x 2)     Status: None (Preliminary result)   Collection Time: 03/11/15  9:02 PM  Result Value Ref Range Status   Specimen Description BLOOD  Final   Special Requests NONE  Final   Culture NO GROWTH < 12 HOURS  Final   Report Status PENDING  Incomplete  Blood Culture (routine x 2)     Status: None (Preliminary result)   Collection Time: 03/11/15  9:02 PM  Result Value Ref Range Status   Specimen Description BLOOD  Final   Special Requests NONE  Final   Culture NO GROWTH < 12 HOURS  Final   Report Status PENDING  Incomplete  Urine culture     Status: None (Preliminary result)   Collection Time: 03/11/15  9:02 PM  Result Value Ref Range Status   Specimen Description URINE, CLEAN CATCH  Final   Special Requests NONE  Final   Culture NO GROWTH < 12 HOURS  Final    Report Status PENDING  Incomplete    Anti-infectives    Start     Dose/Rate Route Frequency Ordered Stop   03/14/15 1000  levofloxacin (LEVAQUIN) IVPB 750 mg     750 mg 100 mL/hr over 90 Minutes Intravenous Every 48 hours 03/12/15 0132     03/12/15 2000  ceFEPIme (MAXIPIME) 2 g in dextrose 5 % 50 mL IVPB     2 g 100 mL/hr over 30 Minutes Intravenous Every 24 hours 03/12/15 1414     03/12/15 1500  vancomycin (VANCOCIN) 500 mg in sodium chloride 0.9 % 100 mL IVPB     500 mg 100 mL/hr over 60 Minutes Intravenous Every 36 hours 03/12/15 0427     03/12/15 1400  aztreonam (AZACTAM) injection 1 g  Status:  Discontinued     1 g Intravenous 3 times per day 03/12/15 1052 03/12/15 1104   03/12/15 1200  aztreonam (AZACTAM) 1 g in dextrose 5 % 50 mL IVPB  Status:  Discontinued     1 g 100 mL/hr over 30 Minutes Intravenous 3 times per day 03/12/15 1104 03/12/15 1414   03/12/15 0815  aztreonam (AZACTAM) injection 1 g  Status:  Discontinued     1 g Intramuscular 3 times per day 03/12/15 0801 03/12/15 1052   03/12/15 0743  fluconazole (DIFLUCAN) tablet 100 mg     100 mg Oral Daily PRN 03/12/15 0743  03/12/15 0145  vancomycin (VANCOCIN) 500 mg in sodium chloride 0.9 % 100 mL IVPB     500 mg 100 mL/hr over 60 Minutes Intravenous  Once 03/12/15 0130 03/12/15 0406   03/12/15 0145  levofloxacin (LEVAQUIN) IVPB 750 mg     750 mg 100 mL/hr over 90 Minutes Intravenous  Once 03/12/15 0132 03/12/15 0543      Assessment: 81 yo female being treated for PNA.   Goal of Therapy:  Vancomycin trough level 15-20 mcg/ml  Plan:  Cefepime: Patient transitioned from aztreonam to cefepime. Will continue patient on cefepime 2g IV Q24hr with first dose at 1800 tonight.    Levofloxacin: Will continue patient on levofloxacin 750mg  IV Q48hr.    Vacnomycin: In setting of improved renal function, will continue patient on vancomycin 500mg  IV q24hr for goal trough of 15-20. Will follow serum creatinine closely. Will  obtain trough prior to dose on 5/22.    Will monitor cultures and recommend narrowing as appropriate.   Pharmacy will continue to monitor and adjust per consult.    Expected duration 7 days with resolution of temperature and/or normalization of WBC  Simpson,Michael L 03/12/2015,4:02 PM

## 2015-03-12 NOTE — Progress Notes (Signed)
Dr. Marcille Blanco Pt. C/o back pain 8/10. Dr. Marcille Blanco ordered morphine 2mg  once

## 2015-03-13 ENCOUNTER — Inpatient Hospital Stay: Payer: Medicare Other

## 2015-03-13 DIAGNOSIS — R1310 Dysphagia, unspecified: Secondary | ICD-10-CM

## 2015-03-13 LAB — CREATININE, SERUM
CREATININE: 0.71 mg/dL (ref 0.44–1.00)
GFR calc Af Amer: >60 mL/min (ref >60)
GFR calc non Af Amer: >60 mL/min (ref >60)

## 2015-03-13 LAB — URINE CULTURE: Culture: NO GROWTH

## 2015-03-13 MED ORDER — METOPROLOL TARTRATE 25 MG PO TABS
25.0000 mg | ORAL_TABLET | Freq: Once | ORAL | Status: AC
Start: 2015-03-13 — End: 2015-03-13
  Administered 2015-03-13: 25 mg via ORAL

## 2015-03-13 MED ORDER — METOPROLOL TARTRATE 25 MG PO TABS
12.5000 mg | ORAL_TABLET | Freq: Two times a day (BID) | ORAL | Status: DC
  Administered 2015-03-13: 25 mg via ORAL
  Administered 2015-03-14 – 2015-03-15 (×3): 12.5 mg via ORAL
  Administered 2015-03-15: 25 mg via ORAL
  Filled 2015-03-13 (×5): qty 1

## 2015-03-13 MED ORDER — FUROSEMIDE 10 MG/ML IJ SOLN
20.0000 mg | Freq: Once | INTRAMUSCULAR | Status: AC
  Administered 2015-03-13: 20 mg via INTRAVENOUS
  Filled 2015-03-13: qty 2

## 2015-03-13 MED ORDER — HYDROMORPHONE HCL 1 MG/ML IJ SOLN
0.2500 mg | INTRAMUSCULAR | Status: DC | PRN
  Administered 2015-03-13 – 2015-03-15 (×5): 0.5 mg via INTRAVENOUS
  Filled 2015-03-13 (×5): qty 1

## 2015-03-13 MED ORDER — METOPROLOL TARTRATE 1 MG/ML IV SOLN
INTRAVENOUS | Status: AC
  Administered 2015-03-13: 08:00:00
  Filled 2015-03-13: qty 5

## 2015-03-13 MED ORDER — FENTANYL 75 MCG/HR TD PT72
75.0000 ug | MEDICATED_PATCH | TRANSDERMAL | Status: DC
  Administered 2015-03-13 – 2015-03-16 (×2): 75 ug via TRANSDERMAL
  Filled 2015-03-13 (×3): qty 1

## 2015-03-13 MED ORDER — METOPROLOL TARTRATE 1 MG/ML IV SOLN: 5.0000 mg | INTRAVENOUS | Status: DC | PRN

## 2015-03-13 NOTE — Progress Notes (Signed)
Patient added to RN assignment at 3pm today. No complaints of pain at this time. Family at bedside. Patient in chair. Repositioned as needed and Q2h. No distress at this time.  Toniann Ket

## 2015-03-13 NOTE — Consult Note (Signed)
Palliative Medicine Inpatient Consult Follow Up Note   Name: Michelle Webb Date: 03/13/2015 MRN: [id number]  DOB: 12/07/1931  Referring Physician: Loletha Grayer, MD  Palliative Care consult requested for this 81 y.o. female for goals of medical therapy in patient with gastric cancer, h/o breast cancer (40 years ago), s/p bilateral mastectomies, COPD, GERD, and depression  Pt currently resting in bed comfortably. Pt states pain regimen is working well.  Friend at bedside.   REVIEW OF SYSTEMS:  Pain: None Dyspnea:  No All other systems were reviewed and found to be negative  CODE STATUS: DNR   PAST MEDICAL HISTORY: Past Medical History  Diagnosis Date  . Cancer     stomach cancer  . Breast cancer     PAST SURGICAL HISTORY:  Past Surgical History  Procedure Laterality Date  . Mastectomy Bilateral   . Abdominal hysterectomy      Vital Signs: BP 107/63 mmHg  Pulse 98  Temp(Src) 98.7 F (37.1 C) (Oral)  Resp 20  Wt 37.43 kg (82 lb 8.3 oz)  SpO2 90% Filed Weights   03/11/15 2042 03/12/15 0649  Weight: 35.29 kg (77 lb 12.8 oz) 37.43 kg (82 lb 8.3 oz)    Estimated body mass index is 16.12 kg/(m^2) as calculated from the following:   Height as of 03/09/15: 5' (1.524 m).   Weight as of this encounter: 37.43 kg (82 lb 8.3 oz).  PHYSICAL EXAM: Generall: cachectic HEENT: OP clear, moist oral mucosa Neck: Trachea midline  Cardiovascular: RRR Pulmonary/Chest: diminished and rales noted to ant fields Abdominal: Soft, NTTP. Hypoactive bowel sounds GU: No SP tenderness, No CVA tenderness Extremities: No edema  Neurological: Grossly nonfocal Skin: Warm, dry and intact.  Psychiatric: Calm, A&O x 3  LABS: CBC:    Component Value Date/Time   WBC 10.5 03/12/2015 0820   WBC 6.6 02/19/2015 1410   HGB 8.8* 03/12/2015 0820   HGB 10.2* 02/19/2015 1410   HCT 27.3* 03/12/2015 0820   HCT 30.4* 02/19/2015 1410   PLT 246 03/12/2015 0820   PLT 183 02/19/2015 1410   MCV 91.6  03/12/2015 0820   MCV 94 02/19/2015 1410   NEUTROABS 10.8* 03/11/2015 2102   NEUTROABS 5.7 02/19/2015 1410   LYMPHSABS 0.6* 03/11/2015 2102   LYMPHSABS 0.4* 02/19/2015 1410   MONOABS 0.9 03/11/2015 2102   MONOABS 0.4 02/19/2015 1410   EOSABS 0.0 03/11/2015 2102   EOSABS 0.0 02/19/2015 1410   BASOSABS 0.0 03/11/2015 2102   BASOSABS 0.0 02/19/2015 1410   Comprehensive Metabolic Panel:    Component Value Date/Time   NA 132* 03/11/2015 2102   NA 132* 02/19/2015 1410   K 4.4 03/11/2015 2102   K 2.6* 02/19/2015 1410   CL 99* 03/11/2015 2102   CL 97* 02/19/2015 1410   CO2 25 03/11/2015 2102   CO2 28 02/19/2015 1410   BUN 30* 03/11/2015 2102   BUN 15 02/19/2015 1410   CREATININE 0.71 03/13/2015 0639   CREATININE 0.82 02/19/2015 1410   GLUCOSE 118* 03/11/2015 2102   GLUCOSE 123* 02/19/2015 1410   CALCIUM 8.0* 03/11/2015 2102   CALCIUM 7.9* 02/19/2015 1410   AST 23 03/11/2015 2102   AST 40 02/19/2015 1410   ALT 16 03/11/2015 2102   ALT 19 02/19/2015 1410   ALKPHOS 122 03/11/2015 2102   ALKPHOS 88 02/19/2015 1410   BILITOT 0.5 03/11/2015 2102   PROT 6.4* 03/11/2015 2102   PROT 6.5 02/19/2015 1410   ALBUMIN 2.1* 03/11/2015 2102   ALBUMIN 3.0*  02/19/2015 1410    IMPRESSION:  Michelle Webb is an 81 y.o. female for goals of medical therapy in patient with gastric cancer, h/o breast cancer (40 years ago), s/p bilateral mastectomies, COPD, GERD, and depression. Pt presented to ER from home with SOB, confusion and back pain. ER workup significant for bilat pleural effusions, PNA?, and leukocytosis. Pt with recent thoracentesis and cytology is pending.   Cytology results were negative for malignancy.  Pt with normal swallow study and is back on her normal diet.  Pt with 63 oral morphine equivalents in last 24 hours.  Will speak with son about increasing fentanyl patch to 44mcg and continue prn medication.  PLAN: See above     More than 50% of the visit was spent in  counseling/coordination of care: YES  Time Spent: 25 minuttes

## 2015-03-13 NOTE — Progress Notes (Signed)
Chief Complaint/Diagnosis:   1. Anemia normocytic, normochromic. 2. Vitamin B 12 deficiency. Iron deficiency. 3. Carcinoma of stomach invasive adenocarcinoma.  Diagnosed by upper endoscopy in August of 2012. based on EUS. and a PET scan patient has T3, N2, M1, disease, stage IV 4. Carboplatinum and Taxol,started in September of 2012. HER-2/neu receptor by IHC is 3+. 5. Herceptin added from October of 2012. 6. Herceptin has been put on hold to give  patient some chemotherapy holidays.November of 2014 7. Recent upper endoscopy as well as CT scan shows no evidence of malignancy(January, 2015) HPI:   Patient was admitted on Wednesday/ Thursday morning with increasing confusion with subsequently elevated WBC count. Patient recently had thoracentesis and the fluid was negative for malignant cells but protein was high and there was high neutrophilic infiltrate suggesting infection. Concern is for aspiration pneumonia as patient has been having more difficulty with swallowing. She is primarily cared for by her son and daughter who are at the bedside.  Review of Systems:  General: Disoriented, increases with use of dilaudid. Continues with weakness.  Performance Status (ECOG): 2  HEENT: no complaints  Lungs: Increasing shortness of breath hypoxia and a recent thoracentesis.    Cardiac: no complaints     GI: Poor appetite.  Increasing difficulty swallowing.  Possible aspiration  GU: no complaints  Musculoskeletal: no complaints  Extremities: no complaints  Skin: no complaints  Neuro: Confusion  Psych: anxiety  depression  Pain: Persistent chest pain in the left side.    Review of Systems: All other systems were reviewed and found to be negative    .  Allergies:  Penicillin: N/V/Diarrhea, GI Distress  Darvon: Hallucinations  Biaxin: Swelling  Aspirin: GI Distress  Significant History/PMH:   Previous blood transfusion:    Anemia:    Heart Murmer:    Stomach Cancer:    COPD:     GERD - Esophageal Reflux:    Hypertension:    Breast Cancer: 40 years ago   Adenocarcinoma: stomach, Aug 2012   Dental Implants:    Oral Surgery: Multiple. Lower jaw replacement.   Mastectomy, bilateral:    hysterectomy and bladder tac:   Smoking History: Smoking History Never Smoked.(1)  PFSH: Comments: Significant family history of ovarian cancer and of breast cancer and colon cancer  Comments: Patient does not smoke does not drink  Additional Past Medical and Surgical History: History of breast cancer 30 years ago.  Had bilateral mastectomy.  No chemotherapy or radiation treatment.  osteo arthritis  Aortic valve  disorder  Rheumatoid fever in childhood  Depression   COPD   Esophageal dilatation   Facial reconstruction in 1994   Home Medications: Medication Instructions Last Modified Date/Time  fentaNYL 25 mcg/hr transdermal film, extended release 1 patch transdermal every 72 hours 28-Apr-16 16:07  CeleXA 20 mg oral tablet 1 tab(s) orally once a day 28-Apr-16 15:14  potassium chloride 20 mEq oral powder for reconstitution 1 each orally once a day 28-Apr-16 15:14  LORazepam 1 mg oral tablet 1 tab(s) orally once 1 hour prior to MRI 28-Apr-16 15:14  Diflucan 100 mg tablet 1 tab(s) orally once a day x 7 days 28-Apr-16 15:14  guaiFENesin 600 mg oral tablet, extended release 1 tab(s) orally every 12 hours 28-Apr-16 15:14  ondansetron 4 mg oral tablet, disintegrating 1 tab(s) orally 3 times a day, As Needed  28-Apr-16 15:14  gabapentin 100 mg oral capsule 2 cap(s) orally 2 times a day 28-Apr-16 15:14  lisinopril 5 mg oral tablet 1  tab(s) orally once a day 28-Apr-16 15:14  ALPRAZolam 0.25 mg oral tablet 1 tab(s) orally 3 times a day, As Needed - for Anxiety, Nervousness 28-Apr-16 15:14  K-Sol 40 mEq/15 mL oral liquid 15 milliliter(s) orally once, take with dinner tonight 28-Apr-16 15:14  clotrimazole 1% 1 applicatorful vaginal once a day (at bedtime), As Needed 28-Apr-16 15:14   promethazine 25 mg rectal suppository 1 SUPP(s) rectal every 6 hours, As Needed 28-Apr-16 15:14  Tylenol 500 mg oral tablet 1 tab(s) orally every 6 hours, As Needed - for Pain 28-Apr-16 15:14  diphenhydrAMINE 25 mg oral capsule 1 cap(s) orally once a day (at bedtime), As Needed 28-Apr-16 15:14  omeprazole 20 mg oral delayed release capsule 1 cap(s) orally 2 times a day 28-Apr-16 15:14  Colace 100 mg oral capsule 1 cap(s) orally 2 times a day 28-Apr-16 15:14   Vital Signs:  :: Ht(CM): 152.4 Wt(KG): 35.7 BSA: 1.2 Temp: 98.5 Pulse: 71 RR: 18  BP: 175/69   Physical Exam:  General: cachectic patient was in mild distress because of pain   Mental Status: apprehensive  Eyes: pupils  are equal reacting to light.    No jaundice  palelooking sclera  Head, Ears, Nose,Throat: normal, no lesions or deformities  Neck, Thyroid: no thyroid tenderness, enlargement or nodule.  neck supple without massess or tenderness. no adenopathy.  Respiratory: lungs: Diminished air  entry.  no  rhonchi.  No rales.  Cardiovascular: ttachycardia  Gastrointestinal: abdomen is soft.  Liver and spleen not palpable.  No ascites.  No tenderness.  Bowel sounds are normal  Musculoskeletal: no muscular asymmetry noted.  no swelling or tenderness of joints.  ROM upper and lower extremities normal  Skin: no rash.  No ecchymosis.no skin lesions.  Neurological: Patient is disoriented but no focal signs  cranial nerves are intact  Muscle power tone within normal limit.  No focal signs.  No sensory deficit.  Lymphatics: no cervical, axillary, or inguinal lymphadenopathy   Lab data has been reviewed: leucocytrosis, Anemia.   Chest x-ray shows bilateral small pleural effusion.   Impression and plan 1. Pulmonary infection. Will continue with IV antibiotics. Most likely related to aspiration as she continues to have difficulty with swallowing.  2. Cachexia and malnutrition due to multiple factors. Discussed in depth with patients  son and he states that that if there is no improvement over the weekend with his moms condition he would lean towards a more Palliative approach to care.  3. Previous history of carcinoma of stomach. At time of last Upper GI there was no sign of recurrent disease.   Will continue to monitor patient over the weekend. Palliative Care is going to adjust pain medications to try and discontinue dilaudid, as it appears to make her confusion worse. Will trial an increase in her Fentanyl patches and possibly even trial Roxanol.   Dr. Mike Gip was available for consultation and review of plan of care for this patient.

## 2015-03-13 NOTE — Consult Note (Signed)
Son and pt's oldest daughter in room.  Spoke about pt's prn needs for pain management. Spoke about increasing fentanyl to 15mcg. Pt's son and daughter agree.  Son states he has noticed pt being more somnolent with last dilaudid dose.  Spoke about using morphine as it is not as strong and son states pt has had hallucinations in past with morphine use.  Spoke about using a range of 0.25-0.5mg  and giving 0.25 mg as next prn dose.  Family agrees.  Time spent: 25 minutes

## 2015-03-13 NOTE — Progress Notes (Signed)
Pt alertx4. VSS. Complaint of chest pain. Medication given. Complaints of feet burning. Socks removed. Pt put in chair. Medication given. 2 assist to transfer. Son at bedside. Pt currently sleeping. Will continue to monitor.

## 2015-03-13 NOTE — Care Management (Signed)
Onset of rapid A Fib this morning requiring IV push medication.  Pain control continues to be issue.  Has been evaluated by oncology, palliative care, and modified barium swallow study.  No evidence of aspiration.  IV antibiotics

## 2015-03-13 NOTE — Plan of Care (Signed)
Problem: SLP Dysphagia Goals Goal: Misc Dysphagia Goal Pt will safely tolerate po diet of least restrictive consistency w/ no overt s/s of aspiration noted by Staff/pt/family x3 sessions.    

## 2015-03-13 NOTE — Procedures (Signed)
Objective Swallowing Evaluation:   MBS Patient Details  Name: Michelle Webb MRN: [id number] Date of Birth: 1932-07-16  Today's Date: 03/13/2015 Time: SLP Start Time (ACUTE ONLY): 0910-SLP Stop Time (ACUTE ONLY): 1010 SLP Time Calculation (min) (ACUTE ONLY): 60 min  Past Medical History:  Past Medical History  Diagnosis Date  . Cancer     stomach cancer  . Breast cancer    Past Surgical History:  Past Surgical History  Procedure Laterality Date  . Mastectomy Bilateral   . Abdominal hysterectomy     HPI:  Other Pertinent Information: pt reported she can only eat/drink a little at a time "because it will come back up on me". This GI/Esophageal dysmotility is baseline for pt per hers and family report. MD is r/o aspiration.   No Data Recorded  Assessment / Plan / Recommendation CHL IP CLINICAL IMPRESSIONS 03/13/2015  Therapy Diagnosis Mild pharyngeal phase dysphagia;Moderate cervical esophageal phase dysphagia  Clinical Impression Pt presented w/ mild+ pharyngeal phase dyspahgia c/b mild delay in pharyngeal swallow initiation moreso w/ thin liquids. Pt did NOT demo. any laryngeal penetration or aspiration during trials during this study following general aspiration precautions of using small, single sips and drinking from cup. Pt exhibited adequate airway protection during the swallow. Following the swallow of boluses trial consistencies, pt exhibited min. pharyngeal residue indicating min. decreased pharygneal pressure during the swallow; she cleared this residue w/ a f/u, "dry" swallow independently. Of note, during the Esopahgeal phase, a slight-min. anterior protrusion of tissue below the UES was notable during the swallow as bolus material passed. It did not appear to impede the bolus material from clearing this area of the Esophagus. Moderate+ bolus stasis was noted w/ bolus residue remaining in the upper Esophagus despite given time. Pt endorsed feelings of food/liquid "coming back up  if I eat or drink too much at a time". This could be occuring and would place pt at high risk for aspiration of Reflux bolus material after the swallow.  Pt appears to safely tolerate single, small bites and sips of soft, cooked foods w/ liquids via cup; aspiration precautions; Reflux precautions. Encouraged pt to take time b/t bites/sips to allow for a f/u swallow and to allow for Esophageal reduction/clearing of bolus material. Rec. meds cut small and/or crushed in purees. Rec. Dietician f/u for consult.       CHL IP TREATMENT RECOMMENDATION 03/13/2015  Treatment Recommendations Therapy as outlined in treatment plan below     CHL IP DIET RECOMMENDATION 03/13/2015  SLP Diet Recommendations Dysphagia 3 (Mech soft);Thin  Liquid Administration via (None)  Medication Administration Whole meds with puree  Compensations Small sips/bites;Slow rate  Postural Changes and/or Swallow Maneuvers (None)     CHL IP OTHER RECOMMENDATIONS 03/13/2015  Recommended Consults Other (Comment)  Oral Care Recommendations Oral care BID  Other Recommendations (None)     No flowsheet data found.   CHL IP FREQUENCY AND DURATION 03/13/2015  Speech Therapy Frequency (ACUTE ONLY) min 2x/week  Treatment Duration 1 week     Pertinent Vitals/Pain Dull pain sitting on her bottom in chair for study; repositioned.    SLP Swallow Goals No flowsheet data found. See care plan.   No flowsheet data found.    CHL IP REASON FOR REFERRAL 03/13/2015  Reason for Referral Objectively evaluate swallowing function     CHL IP ORAL PHASE 03/13/2015  Lips (None)  Tongue (None)  Mucous membranes (None)  Nutritional status (None)  Other (None)  Oxygen therapy (None)  Oral Phase WFL  Oral - Pudding Teaspoon (None)  Oral - Pudding Cup (None)  Oral - Honey Teaspoon (None)  Oral - Honey Cup (None)  Oral - Honey Syringe (None)  Oral - Nectar Teaspoon (None)  Oral - Nectar Cup (None)  Oral - Nectar Straw (None)  Oral - Nectar  Syringe (None)  Oral - Ice Chips (None)  Oral - Thin Teaspoon (None)  Oral - Thin Cup (None)  Oral - Thin Straw (None)  Oral - Thin Syringe (None)  Oral - Puree (None)  Oral - Mechanical Soft (None)  Oral - Regular (None)  Oral - Multi-consistency (None)  Oral - Pill (None)  Oral Phase - Comment (None)      CHL IP PHARYNGEAL PHASE 03/13/2015  Pharyngeal Phase Impaired  Pharyngeal - Pudding Teaspoon (None)  Penetration/Aspiration details (pudding teaspoon) (None)  Pharyngeal - Pudding Cup (None)  Penetration/Aspiration details (pudding cup) (None)  Pharyngeal - Honey Teaspoon (None)  Penetration/Aspiration details (honey teaspoon) (None)  Pharyngeal - Honey Cup (None)  Penetration/Aspiration details (honey cup) (None)  Pharyngeal - Honey Syringe (None)  Penetration/Aspiration details (honey syringe) (None)  Pharyngeal - Nectar Teaspoon (None)  Penetration/Aspiration details (nectar teaspoon) (None)  Pharyngeal - Nectar Cup (None)  Penetration/Aspiration details (nectar cup) (None)  Pharyngeal - Nectar Straw (None)  Penetration/Aspiration details (nectar straw) (None)  Pharyngeal - Nectar Syringe (None)  Penetration/Aspiration details (nectar syringe) (None)  Pharyngeal - Ice Chips (None)  Penetration/Aspiration details (ice chips) (None)  Pharyngeal - Thin Teaspoon (None)  Penetration/Aspiration details (thin teaspoon) (None)  Pharyngeal - Thin Cup (None)  Penetration/Aspiration details (thin cup) (None)  Pharyngeal - Thin Straw (None)  Penetration/Aspiration details (thin straw) (None)  Pharyngeal - Thin Syringe (None)  Penetration/Aspiration details (thin syringe') (None)  Pharyngeal - Puree (None)  Penetration/Aspiration details (puree) (None)  Pharyngeal - Mechanical Soft (None)  Penetration/Aspiration details (mechanical soft) (None)  Pharyngeal - Regular (None)  Penetration/Aspiration details (regular) (None)  Pharyngeal - Multi-consistency (None)   Penetration/Aspiration details (multi-consistency) (None)  Pharyngeal - Pill (None)  Penetration/Aspiration details (pill) (None)  Pharyngeal Comment pt was able to clear the min. pharyngeal residue w/ a f/u, independent swallow. No laryngeal penetration or aspiration occured during trials.      CHL IP CERVICAL ESOPHAGEAL PHASE 03/13/2015  Cervical Esophageal Phase Impaired  Pudding Teaspoon (None)  Pudding Cup (None)  Honey Teaspoon (None)  Honey Cup (None)  Honey Straw (None)  Nectar Teaspoon (None)  Nectar Cup (None)  Nectar Straw (None)  Nectar Sippy Cup (None)  Thin Teaspoon (None)  Thin Cup (None)  Thin Straw (None)  Thin Sippy Cup (None)  Cervical Esophageal Comment (No Data)    No flowsheet data found.         Watson,Katherine 03/13/2015, 10:19 AM   Objective Swallowing Evaluation:    Patient Details  Name: JALAYA SARVER MRN: [id number] Date of Birth: 25-Jul-1932  Today's Date: 03/13/2015 Time: SLP Start Time (ACUTE ONLY): 0910-SLP Stop Time (ACUTE ONLY): 1010 SLP Time Calculation (min) (ACUTE ONLY): 60 min  Past Medical History:  Past Medical History  Diagnosis Date  . Cancer     stomach cancer  . Breast cancer    Past Surgical History:  Past Surgical History  Procedure Laterality Date  . Mastectomy Bilateral   . Abdominal hysterectomy     HPI:  Other Pertinent Information: pt reported she can only eat/drink a little at a time "because it will come back up on me". This GI/Esophageal dysmotility is baseline  for pt per hers and family report. MD is r/o aspiration.   No Data Recorded  Assessment / Plan / Recommendation CHL IP CLINICAL IMPRESSIONS 03/13/2015  Therapy Diagnosis Mild pharyngeal phase dysphagia;Moderate cervical esophageal phase dysphagia  Clinical Impression Pt presented w/ mild+ pharyngeal phase dyspahgia c/b mild delay in pharyngeal swallow initiation moreso w/ thin liquids. Pt did NOT demo. any laryngeal penetration or aspiration  during trials during this study following general aspiration precautions of using small, single sips and drinking from cup. Pt exhibited adequate airway protection during the swallow. Following the swallow of boluses trial consistencies, pt exhibited min. pharyngeal residue indicating min. decreased pharygneal pressure during the swallow; she cleared this residue w/ a f/u, "dry" swallow independently. Of note, during the Esopahgeal phase, a slight-min. anterior protrusion of tissue below the UES was notable during the swallow as bolus material passed. It did not appear to impede the bolus material from clearing this area of the Esophagus. Moderate+ bolus stasis was noted w/ bolus residue remaining in the upper Esophagus despite given time. Pt endorsed feelings of food/liquid "coming back up if I eat or drink too much at a time". This could be occuring and would place pt at high risk for aspiration of Reflux bolus material after the swallow.  Pt appears to safely tolerate single, small bites and sips of soft, cooked foods w/ liquids via cup; aspiration precautions; Reflux precautions. Encouraged pt to take time b/t bites/sips to allow for a f/u swallow and to allow for Esophageal reduction/clearing of bolus material. Rec. meds cut small and/or crushed in purees. Rec. Dietician f/u for consult.       CHL IP TREATMENT RECOMMENDATION 03/13/2015  Treatment Recommendations Therapy as outlined in treatment plan below     CHL IP DIET RECOMMENDATION 03/13/2015  SLP Diet Recommendations Dysphagia 3 (Mech soft);Thin  Liquid Administration via (None)  Medication Administration Whole meds with puree  Compensations Small sips/bites;Slow rate  Postural Changes and/or Swallow Maneuvers (None)     CHL IP OTHER RECOMMENDATIONS 03/13/2015  Recommended Consults Other (Comment)  Oral Care Recommendations Oral care BID  Other Recommendations (None)     No flowsheet data found.   CHL IP FREQUENCY AND DURATION 03/13/2015   Speech Therapy Frequency (ACUTE ONLY) min 2x/week  Treatment Duration 1 week     Pertinent Vitals/Pain Dull pain sitting on her bottom in chair; repositioned.    SLP Swallow Goals No flowsheet data found.  No flowsheet data found.    CHL IP REASON FOR REFERRAL 03/13/2015  Reason for Referral Objectively evaluate swallowing function     CHL IP ORAL PHASE 03/13/2015  Lips (None)  Tongue (None)  Mucous membranes (None)  Nutritional status (None)  Other (None)  Oxygen therapy (None)  Oral Phase WFL  Oral - Pudding Teaspoon (None)  Oral - Pudding Cup (None)  Oral - Honey Teaspoon (None)  Oral - Honey Cup (None)  Oral - Honey Syringe (None)  Oral - Nectar Teaspoon (None)  Oral - Nectar Cup (None)  Oral - Nectar Straw (None)  Oral - Nectar Syringe (None)  Oral - Ice Chips (None)  Oral - Thin Teaspoon (None)  Oral - Thin Cup (None)  Oral - Thin Straw (None)  Oral - Thin Syringe (None)  Oral - Puree (None)  Oral - Mechanical Soft (None)  Oral - Regular (None)  Oral - Multi-consistency (None)  Oral - Pill (None)  Oral Phase - Comment (None)      CHL IP PHARYNGEAL PHASE  03/13/2015  Pharyngeal Phase Impaired  Pharyngeal - Pudding Teaspoon (None)  Penetration/Aspiration details (pudding teaspoon) (None)  Pharyngeal - Pudding Cup (None)  Penetration/Aspiration details (pudding cup) (None)  Pharyngeal - Honey Teaspoon (None)  Penetration/Aspiration details (honey teaspoon) (None)  Pharyngeal - Honey Cup (None)  Penetration/Aspiration details (honey cup) (None)  Pharyngeal - Honey Syringe (None)  Penetration/Aspiration details (honey syringe) (None)  Pharyngeal - Nectar Teaspoon (None)  Penetration/Aspiration details (nectar teaspoon) (None)  Pharyngeal - Nectar Cup (None)  Penetration/Aspiration details (nectar cup) (None)  Pharyngeal - Nectar Straw (None)  Penetration/Aspiration details (nectar straw) (None)  Pharyngeal - Nectar Syringe (None)  Penetration/Aspiration  details (nectar syringe) (None)  Pharyngeal - Ice Chips (None)  Penetration/Aspiration details (ice chips) (None)  Pharyngeal - Thin Teaspoon (None)  Penetration/Aspiration details (thin teaspoon) (None)  Pharyngeal - Thin Cup (None)  Penetration/Aspiration details (thin cup) (None)  Pharyngeal - Thin Straw (None)  Penetration/Aspiration details (thin straw) (None)  Pharyngeal - Thin Syringe (None)  Penetration/Aspiration details (thin syringe') (None)  Pharyngeal - Puree (None)  Penetration/Aspiration details (puree) (None)  Pharyngeal - Mechanical Soft (None)  Penetration/Aspiration details (mechanical soft) (None)  Pharyngeal - Regular (None)  Penetration/Aspiration details (regular) (None)  Pharyngeal - Multi-consistency (None)  Penetration/Aspiration details (multi-consistency) (None)  Pharyngeal - Pill (None)  Penetration/Aspiration details (pill) (None)  Pharyngeal Comment pt was able to clear the min. pharyngeal residue w/ a f/u, independent swallow. No laryngeal penetration or aspiration occured during trials.      CHL IP CERVICAL ESOPHAGEAL PHASE 03/13/2015  Cervical Esophageal Phase Impaired  Pudding Teaspoon (None)  Pudding Cup (None)  Honey Teaspoon (None)  Honey Cup (None)  Honey Straw (None)  Nectar Teaspoon (None)  Nectar Cup (None)  Nectar Straw (None)  Nectar Sippy Cup (None)  Thin Teaspoon (None)  Thin Cup (None)  Thin Straw (None)  Thin Sippy Cup (None)  Cervical Esophageal Comment (No Data)    No flowsheet data found.         Watson,Katherine 03/13/2015, 10:19 AM

## 2015-03-13 NOTE — Progress Notes (Signed)
[facility] PROGRESS NOTE  Michelle Webb [id number]:[id number] DOB: August 16, 1932 DOA: 03/11/2015 PCP: Sofie Hartigan, MD  HPI/Subjective: Called with fast heart rate and atrial fibrillation. Patient at that time was resting in bed comfortably. She states that she did not sleep too well last night. She is currently not in any pain. But she did grimace when she sat forward. Poor appetite.  Objective: Filed Vitals:   03/13/15 0811  BP: 107/63  Pulse:   Temp:   Resp:     Intake/Output Summary (Last 24 hours) at 03/13/15 0817 Last data filed at 03/13/15 0248  Gross per 24 hour  Intake      0 ml  Output    300 ml  Net   -300 ml   Filed Weights   03/11/15 2042 03/12/15 0649  Weight: 35.29 kg (77 lb 12.8 oz) 37.43 kg (82 lb 8.3 oz)    ROS: Review of Systems  Constitutional: Positive for weight loss. Negative for fever and chills.  HENT: Positive for nosebleeds.   Eyes: Negative for blurred vision.  Respiratory: Positive for shortness of breath. Negative for cough.   Cardiovascular: Negative for chest pain.  Gastrointestinal: Positive for abdominal pain. Negative for nausea, vomiting, diarrhea and constipation.  Genitourinary: Negative for dysuria.  Musculoskeletal: Negative for joint pain.  Neurological: Negative for dizziness and headaches.   Exam: Physical Exam  Constitutional: She appears cachectic.  HENT:  Nose: No mucosal edema.  Mouth/Throat: No oropharyngeal exudate or posterior oropharyngeal edema.  Blood tinged dried from the right nostril. Thrush seen on tongue.  Eyes: Conjunctivae, EOM and lids are normal. Pupils are equal, round, and reactive to light.  Neck: No JVD present. Carotid bruit is not present. No edema present. No thyroid mass and no thyromegaly present.  Cardiovascular: S1 normal and S2 normal.  An irregularly irregular rhythm present. Tachycardia present.  Exam reveals no gallop.   No murmur heard. Pulses:      Dorsalis pedis pulses are 1+  on the right side, and 1+ on the left side.  Respiratory: No respiratory distress. She has decreased breath sounds in the right lower field and the left lower field. She has no wheezes. She has rhonchi in the right lower field and the left lower field. She has no rales.  GI: Soft. Bowel sounds are normal. There is tenderness in the epigastric area.  Lymphadenopathy:    She has no cervical adenopathy.  Neurological: She is alert. No cranial nerve deficit.  Skin: Skin is warm. No rash noted. Nails show no clubbing.  Psychiatric: She has a normal mood and affect.   Data Reviewed: Basic Metabolic Panel:  Recent Labs Lab 03/11/15 2102 03/12/15 0820 03/13/15 0639  NA 132*  --   --   K 4.4  --   --   CL 99*  --   --   CO2 25  --   --   GLUCOSE 118*  --   --   BUN 30*  --   --   CREATININE 0.93 0.67 0.71  CALCIUM 8.0*  --   --    Liver Function Tests:  Recent Labs Lab 03/11/15 2102  AST 23  ALT 16  ALKPHOS 122  BILITOT 0.5  PROT 6.4*  ALBUMIN 2.1*    Recent Labs Lab 03/11/15 2102  LIPASE 21*    CBC:  Recent Labs Lab 03/11/15 2102 03/12/15 0820  WBC 12.3* 10.5  NEUTROABS 10.8*  --   HGB 9.5* 8.8*  HCT 28.7* 27.3*  MCV 92.1 91.6  PLT 291 246   Cardiac Enzymes:  Recent Labs Lab 03/11/15 2102  TROPONINI 0.04*   Studies: Dg Chest Port 1 View  03/11/2015   CLINICAL DATA:  Weakness, confusion, fever and difficulty breathing. Gastric cancer with pleural effusions. Post thoracentesis 2 days prior.  EXAM: PORTABLE CHEST - 1 VIEW  COMPARISON:  Chest radiograph 03/09/2015  FINDINGS: Tip of the right chest port in the mid SVC. Lower lung volumes from prior. Cardiomegaly is unchanged allowing for differences in technique. Bilateral pleural effusions with bibasilar airspace disease, left greater than right. This is unchanged from prior exam. There is no pneumothorax. No pulmonary edema.  IMPRESSION: Lower lung volumes with unchanged bilateral pleural effusions and  associated bibasilar airspace disease. No pneumothorax or significant change from prior.   Electronically Signed   By: Jeb Levering M.D.   On: 03/11/2015 21:36    Scheduled Meds: . ALPRAZolam  0.25 mg Oral BID  . ceFEPime (MAXIPIME) IV  2 g Intravenous Q24H  . fentaNYL  50 mcg Transdermal Q72H  . heparin  5,000 Units Subcutaneous 3 times per day  . [START ON 03/14/2015] levofloxacin (LEVAQUIN) IV  750 mg Intravenous Q48H  . metoprolol      . metoprolol tartrate  12.5 mg Oral BID  . pantoprazole  40 mg Oral [id number]  . phenazopyridine  100 mg Oral TID  . polyethylene glycol  17 g Oral Daily  . potassium chloride  20 mEq Oral Daily  . sertraline  50 mg Oral Daily  . sodium chloride  3 mL Intravenous Q12H  . vancomycin  500 mg Intravenous Q24H   Continuous Infusions:   Assessment/Plan:  1. Clinical sepsis, bibasilar pneumonia, patient is immunocompromised with history of cancer. Patient is on triple antibiotics with vancomycin and Levaquin and cefepime. Patient had a recent thoracentesis which did not show cancer as per Dr. Oliva Bustard. Hold off on CT scan today. 2. Atrial fibrillation with rapid ventricular response. Blood pressure acceptable. We'll give 5 mg IV metoprolol and metoprolol 25 mg stat. Will give 12.5 mg of metoprolol twice a day. I will try to keep out of the ICU. Patient is not a good candidate for anticoagulation. Check a TSH. DC IV fluids. 3. Chest pain: IV dilaudid, when necessary Roxanol continue fentanyl patch. Palliative care consultation for pain management. 4. 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 Dr. Oliva Bustard put in for a swallow eval. 5. Gastroesophageal reflux disease without esophagitis on Protonix 6. Hypertension essential on lisinopril. 7. Depression on low dose Zoloft. 8. Thrush; continue Diflucan. 9. Anemia of chronic disease, likely further dilutional with IV fluid hydration. 10. CODE STATUS changed to DO NOT RESUSCITATE.  Code Status:      Code Status Orders        Start     Ordered   03/12/15 0758  Do not attempt resuscitation (DNR)   Continuous    Question Answer Comment  In the event of cardiac or respiratory ARREST Do not call a "code blue"   In the event of cardiac or respiratory ARREST Do not perform Intubation, CPR, defibrillation or ACLS   In the event of cardiac or respiratory ARREST Use medication by any route, position, wound care, and other measures to relive pain and suffering. May use oxygen, suction and manual treatment of airway obstruction as needed for comfort.   Comments nurse may pronounce      03/12/15 [phone]    Advance  Directive Documentation        Most Recent Value   Type of Advance Directive  Healthcare Power of Attorney   Pre-existing out of facility DNR order (yellow form or pink MOST form)     "MOST" Form in Place?       Family Communication: Son on phone, family friend at bedside. Disposition Plan: To be determined  Consultants:  Palliative care  Oncology  Time spent: 30 minutes. Case discussed with nursing staff on the floor.  Loletha Grayer  [facility]

## 2015-03-14 LAB — BASIC METABOLIC PANEL
Anion gap: 7 (ref 5–15)
BUN: 24 mg/dL — ABNORMAL HIGH (ref 6–20)
CHLORIDE: 104 mmol/L (ref 101–111)
CO2: 23 mmol/L (ref 22–32)
CREATININE: 0.85 mg/dL (ref 0.44–1.00)
Calcium: 7.5 mg/dL — ABNORMAL LOW (ref 8.9–10.3)
GFR calc non Af Amer: >60 mL/min (ref >60)
Glucose, Bld: 78 mg/dL (ref 65–99)
Potassium: 4.4 mmol/L (ref 3.5–5.1)
SODIUM: 134 mmol/L — AB (ref 135–145)

## 2015-03-14 LAB — TSH: TSH: 1.018 u[IU]/mL (ref 0.350–4.500)

## 2015-03-14 MED ORDER — SODIUM CHLORIDE 0.9 % IV SOLN
INTRAVENOUS | Status: DC
  Administered 2015-03-15: via INTRAVENOUS

## 2015-03-14 MED ORDER — MEGESTROL ACETATE 400 MG/10ML PO SUSP
400.0000 mg | Freq: Two times a day (BID) | ORAL | Status: DC
  Administered 2015-03-14 – 2015-03-15 (×3): 400 mg via ORAL
  Filled 2015-03-14 (×6): qty 10

## 2015-03-14 MED ORDER — NYSTATIN 100000 UNIT/ML MT SUSP
5.0000 mL | Freq: Four times a day (QID) | OROMUCOSAL | Status: DC
  Administered 2015-03-15: 500000 [IU] via ORAL
  Filled 2015-03-14 (×9): qty 5

## 2015-03-14 MED ORDER — RANITIDINE HCL 150 MG/10ML PO SYRP
150.0000 mg | ORAL_SOLUTION | Freq: Two times a day (BID) | ORAL | Status: DC
  Administered 2015-03-14 – 2015-03-15 (×3): 150 mg via ORAL
  Filled 2015-03-14 (×6): qty 10

## 2015-03-14 MED ORDER — MAGIC MOUTHWASH
10.0000 mL | Freq: Four times a day (QID) | ORAL | Status: DC | PRN
  Administered 2015-03-14: 10 mL via ORAL
  Filled 2015-03-14 (×2): qty 10

## 2015-03-14 MED ORDER — ALPRAZOLAM 0.25 MG PO TABS
0.2500 mg | ORAL_TABLET | Freq: Three times a day (TID) | ORAL | Status: DC
  Administered 2015-03-14 – 2015-03-16 (×5): 0.25 mg via ORAL
  Filled 2015-03-14 (×6): qty 1

## 2015-03-14 NOTE — Progress Notes (Signed)
[facility] PROGRESS NOTE  Michelle Webb [id number] DOB: [birth year] DOA: 03/11/2015 PCP: Sofie Hartigan, MD  HPI/Subjective: Patient had a very restless night last night , currently sleeping. She states that she wants to go home. She is not eating much or drinking much at all. Continues to have chest pain requiring pain medications.  Objective: Filed Vitals:   03/14/15 1129  BP: 108/42  Pulse: 84  Temp: 98.2 F (36.8 C)  Resp: 20    Intake/Output Summary (Last 24 hours) at 03/14/15 1245 Last data filed at 03/14/15 1239  Gross per 24 hour  Intake    200 ml  Output    700 ml  Net   -500 ml   Filed Weights   03/11/15 2042 03/12/15 0649 03/14/15 0351  Weight: 35.29 kg (77 lb 12.8 oz) 37.43 kg (82 lb 8.3 oz) 36.469 kg (80 lb 6.4 oz)    ROS: Review of Systems  Constitutional: Positive for weight loss. Negative for fever and chills.  HENT: Positive for nosebleeds.   Eyes: Negative for blurred vision.  Respiratory: Positive for shortness of breath. Negative for cough.   Cardiovascular: Positive for chest pain.  Gastrointestinal: Positive for abdominal pain. Negative for nausea, vomiting, diarrhea and constipation.  Genitourinary: Negative for dysuria.  Musculoskeletal: Negative for joint pain.  Neurological: Negative for dizziness and headaches.   Exam: Physical Exam  Constitutional: She appears cachectic.  HENT:  Nose: No mucosal edema.  Mouth/Throat: No oropharyngeal exudate or posterior oropharyngeal edema.  Blood tinged dried from the right nostril. Thrush seen on tongue.  Eyes: Conjunctivae, EOM and lids are normal. Pupils are equal, round, and reactive to light.  Neck: No JVD present. Carotid bruit is not present. No edema present. No thyroid mass and no thyromegaly present.  Cardiovascular: S1 normal and S2 normal.  An irregularly irregular rhythm present. present.  Exam reveals no gallop.   No murmur heard. Pulses:      Dorsalis pedis pulses  are 1+ on the right side, and 1+ on the left side.  Respiratory: No respiratory distress. She has decreased breath sounds in the right lower field and the left lower field. She has no wheezes. She has rhonchi in the right lower field and the left lower field. She has no rales.  GI: Soft. Bowel sounds are normal. There is tenderness in the epigastric area.  Lymphadenopathy:    She has no cervical adenopathy.  Neurological: She is alert. No cranial nerve deficit.  Skin: Skin is warm. No rash noted. Nails show no clubbing.  Psychiatric: She has a normal mood and affect.   Data Reviewed: Basic Metabolic Panel:  Recent Labs Lab 03/11/15 2102 03/12/15 0820 03/13/15 0639 03/14/15 0446  NA 132*  --   --  134*  K 4.4  --   --  4.4  CL 99*  --   --  104  CO2 25  --   --  23  GLUCOSE 118*  --   --  78  BUN 30*  --   --  24*  CREATININE 0.93 0.67 0.71 0.85  CALCIUM 8.0*  --   --  7.5*   Liver Function Tests:  Recent Labs Lab 03/11/15 2102  AST 23  ALT 16  ALKPHOS 122  BILITOT 0.5  PROT 6.4*  ALBUMIN 2.1*    Recent Labs Lab 03/11/15 2102  LIPASE 21*    CBC:  Recent Labs Lab 03/11/15 2102 03/12/15 0820  WBC 12.3* 10.5  NEUTROABS 10.8*  --   HGB 9.5* 8.8*  HCT 28.7* 27.3*  MCV 92.1 91.6  PLT 291 246   Cardiac Enzymes:  Recent Labs Lab 03/11/15 2102  TROPONINI 0.04*   Studies: No results found.  Scheduled Meds: . ALPRAZolam  0.25 mg Oral TID  . ceFEPime (MAXIPIME) IV  2 g Intravenous Q24H  . fentaNYL  75 mcg Transdermal Q72H  . heparin  5,000 Units Subcutaneous 3 times per day  . levofloxacin (LEVAQUIN) IV  750 mg Intravenous Q48H  . megestrol  400 mg Oral BID  . metoprolol tartrate  12.5 mg Oral BID  . phenazopyridine  100 mg Oral TID  . polyethylene glycol  17 g Oral Daily  . potassium chloride  20 mEq Oral Daily  . ranitidine  150 mg Oral BID  . sertraline  50 mg Oral Daily  . sodium chloride  3 mL Intravenous Q12H  . vancomycin  500 mg  Intravenous Q24H   Continuous Infusions:   Assessment/Plan:  1. Clinical sepsis, bibasilar pneumonia, patient is immunocompromised with history of cancer. Continue antibiotics . 2. Atrial fibrillation with rapid ventricular response. Patient's heart rate is improved not a candidate for anticoagulation  3. Chest pain: IV dilaudid, when necessary Roxanol continue fentanyl patch. Palliative care consultation for pain management appreciated 4. 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 Dr. Oliva Bustard seen, prognosis poor Gastroesophageal reflux disease without esophagitis on Protonix. Due to poor by mouth intake I will start on Megace 5. Hypertension essential on lisinopril. 6. Depression on low dose Zoloft. 7. Thrush; continue Diflucan. 8. Anemia of chronic disease, likely further dilutional with IV fluid hydration. 9. CODE STATUS changed to DO NOT RESUSCITATE.  Code Status:     Code Status Orders        Start     Ordered   03/12/15 0758  Do not attempt resuscitation (DNR)   Continuous    Question Answer Comment  In the event of cardiac or respiratory ARREST Do not call a "code blue"   In the event of cardiac or respiratory ARREST Do not perform Intubation, CPR, defibrillation or ACLS   In the event of cardiac or respiratory ARREST Use medication by any route, position, wound care, and other measures to relive pain and suffering. May use oxygen, suction and manual treatment of airway obstruction as needed for comfort.   Comments nurse may pronounce      03/12/15 0758    Advance Directive Documentation        Most Recent Value   Type of Advance Directive  Healthcare Power of Attorney   Pre-existing out of facility DNR order (yellow form or pink MOST form)     "MOST" Form in Place?       Family Communication: Son on phone, family friend at bedside. Disposition Plan: To be determined  Consultants:  Palliative care  Oncology  Time spent: 30 minutes. Case discussed  with nursing staff on the floor.  Posey Pronto [place] [facility]

## 2015-03-15 LAB — BASIC METABOLIC PANEL
ANION GAP: 3 — AB (ref 5–15)
BUN: 18 mg/dL (ref 6–20)
CALCIUM: 7.6 mg/dL — AB (ref 8.9–10.3)
CHLORIDE: 106 mmol/L (ref 101–111)
CO2: 26 mmol/L (ref 22–32)
Creatinine, Ser: 0.73 mg/dL (ref 0.44–1.00)
GFR calc Af Amer: >60 mL/min (ref >60)
Glucose, Bld: 93 mg/dL (ref 65–99)
Potassium: 4.8 mmol/L (ref 3.5–5.1)
SODIUM: 135 mmol/L (ref 135–145)

## 2015-03-15 LAB — PLATELET COUNT: PLATELETS: 261 10*3/uL (ref 150–440)

## 2015-03-15 LAB — VANCOMYCIN, TROUGH: VANCOMYCIN TR: 7 ug/mL — AB (ref 10–20)

## 2015-03-15 MED ORDER — LORAZEPAM 2 MG/ML IJ SOLN
2.0000 mg | INTRAMUSCULAR | Status: DC | PRN
  Administered 2015-03-15 – 2015-03-16 (×2): 2 mg via INTRAVENOUS
  Filled 2015-03-15 (×2): qty 1

## 2015-03-15 MED ORDER — MORPHINE SULFATE 2 MG/ML IJ SOLN
2.0000 mg | INTRAMUSCULAR | Status: DC | PRN
  Administered 2015-03-15 – 2015-03-16 (×4): 2 mg via INTRAVENOUS
  Filled 2015-03-15 (×4): qty 1

## 2015-03-15 MED ORDER — VANCOMYCIN HCL IN DEXTROSE 750-5 MG/150ML-% IV SOLN
750.0000 mg | INTRAVENOUS | Status: DC
  Administered 2015-03-15: 750 mg via INTRAVENOUS
  Filled 2015-03-15 (×3): qty 150

## 2015-03-15 NOTE — Progress Notes (Signed)
Pt and patient's family request taking the patient home with hospice. MD notified. Orders to suspend transfer.

## 2015-03-15 NOTE — Progress Notes (Signed)
[facility] PROGRESS NOTE  Michelle Webb [id number] DOB: [birth year] DOA: 03/11/2015 PCP: Sofie Hartigan, MD  HPI/Subjective: Patient continues to have intermittent chest pain and is not able to take much by mouth in. Her son and daughter-in-law are at bedside.  Objective: Filed Vitals:   03/15/15 1121  BP: 112/50  Pulse: 86  Temp:   Resp: 16    Intake/Output Summary (Last 24 hours) at 03/15/15 1207 Last data filed at 03/15/15 1130  Gross per 24 hour  Intake 528.75 ml  Output      0 ml  Net 528.75 ml   Filed Weights   03/12/15 0649 03/14/15 0351 03/15/15 0449  Weight: 37.43 kg (82 lb 8.3 oz) 36.469 kg (80 lb 6.4 oz) 36.48 kg (80 lb 6.8 oz)    ROS:  unable to obtain due to patient being drowsy  Exam: Physical Exam  Constitutional: She appears cachectic.  HENT:  Nose: No mucosal edema.  Mouth/Throat: No oropharyngeal exudate or posterior oropharyngeal edema.  Blood tinged dried from the right nostril. Thrush seen on tongue.  Eyes: Conjunctivae, EOM and lids are normal. Pupils are equal, round, and reactive to light.  Neck: No JVD present. Carotid bruit is not present. No edema present. No thyroid mass and no thyromegaly present.  Cardiovascular: S1 normal and S2 normal.  An irregularly irregular rhythm present. present.  Exam reveals no gallop.   No murmur heard. Pulses:      Dorsalis pedis pulses are 1+ on the right side, and 1+ on the left side.  Respiratory: No respiratory distress. She has decreased breath sounds in the right lower field and the left lower field. She has no wheezes. She has rhonchi in the right lower field and the left lower field. She has no rales.  GI: Soft. Bowel sounds are normal. There is tenderness in the epigastric area.  Lymphadenopathy:    She has no cervical adenopathy.  Neurological: Very drowsy  Skin: Skin is warm. No rash noted. Nails show no clubbing.  Psychiatric: Drowsy   Data Reviewed: Basic Metabolic  Panel:  Recent Labs Lab 03/11/15 2102 03/12/15 0820 03/13/15 0639 03/14/15 0446 03/15/15 0600  NA 132*  --   --  134* 135  K 4.4  --   --  4.4 4.8  CL 99*  --   --  104 106  CO2 25  --   --  23 26  GLUCOSE 118*  --   --  78 93  BUN 30*  --   --  24* 18  CREATININE 0.93 0.67 0.71 0.85 0.73  CALCIUM 8.0*  --   --  7.5* 7.6*   Liver Function Tests:  Recent Labs Lab 03/11/15 2102  AST 23  ALT 16  ALKPHOS 122  BILITOT 0.5  PROT 6.4*  ALBUMIN 2.1*    Recent Labs Lab 03/11/15 2102  LIPASE 21*    CBC:  Recent Labs Lab 03/11/15 2102 03/12/15 0820 03/15/15 0600  WBC 12.3* 10.5  --   NEUTROABS 10.8*  --   --   HGB 9.5* 8.8*  --   HCT 28.7* 27.3*  --   MCV 92.1 91.6  --   PLT 291 246 261   Cardiac Enzymes:  Recent Labs Lab 03/11/15 2102  TROPONINI 0.04*   Studies: No results found.  Scheduled Meds: . ALPRAZolam  0.25 mg Oral TID  . ceFEPime (MAXIPIME) IV  2 g Intravenous Q24H  . fentaNYL  75 mcg Transdermal Q72H  .  heparin  5,000 Units Subcutaneous 3 times per day  . levofloxacin (LEVAQUIN) IV  750 mg Intravenous Q48H  . megestrol  400 mg Oral BID  . metoprolol tartrate  12.5 mg Oral BID  . nystatin  5 mL Oral QID  . phenazopyridine  100 mg Oral TID  . polyethylene glycol  17 g Oral Daily  . potassium chloride  20 mEq Oral Daily  . ranitidine  150 mg Oral BID  . sertraline  50 mg Oral Daily  . sodium chloride  3 mL Intravenous Q12H  . vancomycin  500 mg Intravenous Q24H   Continuous Infusions: . sodium chloride 75 mL/hr at 03/15/15 0017    Assessment/Plan:  1. Clinical sepsis, bibasilar pneumonia, patient is immunocompromised with history of cancer. Continue antibiotics . 2. Atrial fibrillation with rapid ventricular response. Patient's heart rate is improved not a candidate for anticoagulation  3. Chest pain: IV dilaudid, when necessary Roxanol continue fentanyl patch. Palliative care consultation for pain management appreciated 4. History  of stomach cancer with metastases to the lungs. Dr. Oliva Bustard seen, prognosis poor Gastroesophageal reflux disease without esophagitis on Protonix. Due to poor by mouth intake I will start on Megace 5. Hypertension essential on lisinopril. 6. Depression on low dose Zoloft. 7. Thrush; continue Diflucan. 8. Anemia of chronic disease, likely further dilutional with IV fluid hydration. 9. CODE STATUS changed to DO NOT RESUSCITATE. I have d/w son regarding failure to improve, he would like her to continue antibiotics but would like to start the comfort care process  Code Status:     Code Status Orders        Start     Ordered   03/12/15 0758  Do not attempt resuscitation (DNR)   Continuous    Question Answer Comment  In the event of cardiac or respiratory ARREST Do not call a "code blue"   In the event of cardiac or respiratory ARREST Do not perform Intubation, CPR, defibrillation or ACLS   In the event of cardiac or respiratory ARREST Use medication by any route, position, wound care, and other measures to relive pain and suffering. May use oxygen, suction and manual treatment of airway obstruction as needed for comfort.   Comments nurse may pronounce      03/12/15 0758    Advance Directive Documentation        Most Recent Value   Type of Advance Directive  Healthcare Power of Attorney   Pre-existing out of facility DNR order (yellow form or pink MOST form)     "MOST" Form in Place?       Family Communication: Son on phone, family friend at bedside. Disposition Plan: To be determined  Consultants:  Palliative care  Oncology  Time spent: 30 minutes. Case discussed with nursing staff on the floor.  Posey Pronto [place] [facility]

## 2015-03-15 NOTE — Progress Notes (Signed)
Patient more alert and oriented. Complained of back pain, dilaudid given. Magic mouthwash ordered and given for mouth pain. Notified Dr. Lavetta Nielsen of patient's very little urination, in fact going all day and only voiding once and then only voiding once during the night. BLadder scan only showed 221ml. IVF ordered and started. Discussed this with patient son who stayed with patient all night.

## 2015-03-15 NOTE — Progress Notes (Signed)
ANTIBIOTIC CONSULT NOTE - FOLLOW UP  Pharmacy Consult for Cefepime, Levofloxacin, Vancomycin  Indication: pneumonia  Allergies  Allergen Reactions  . Aspirin Nausea Only  . Clarithromycin Other (See Comments)    Reaction:  Unknown   . Propoxyphene Other (See Comments)    Reaction:  Unknown   . Penicillins Rash    Patient Measurements: Height: 5' (152.4 cm) Weight: 80 lb 6.8 oz (36.48 kg) IBW/kg (Calculated) : 45.5  Vital Signs: Temp: 97.7 F (36.5 C) (05/22 0449) Temp Source: Oral (05/22 0449) BP: 112/50 mmHg (05/22 1121) Pulse Rate: 86 (05/22 1121) Intake/Output from previous day: 05/21 0701 - 05/22 0700 In: 648.8 [P.O.:120; I.V.:428.8; IV Piggyback:100] Out: 0  Intake/Output from this shift: Total I/O In: 0  Out: 200 [Urine:200]  Labs:  Recent Labs  03/13/15 0639 03/14/15 0446 03/15/15 0600  PLT  --   --  261  CREATININE 0.71 0.85 0.73   Estimated Creatinine Clearance: 31.8 mL/min (by C-G formula based on Cr of 0.73).  Recent Labs  03/15/15 1358  [place] 7*     Microbiology: Recent Results (from the past 720 hour(s))  Blood Culture (routine x 2)     Status: None (Preliminary result)   Collection Time: 03/11/15  9:02 PM  Result Value Ref Range Status   Specimen Description BLOOD  Final   Special Requests NONE  Final   Culture NO GROWTH 4 DAYS  Final   Report Status PENDING  Incomplete  Blood Culture (routine x 2)     Status: None (Preliminary result)   Collection Time: 03/11/15  9:02 PM  Result Value Ref Range Status   Specimen Description BLOOD  Final   Special Requests NONE  Final   Culture NO GROWTH 4 DAYS  Final   Report Status PENDING  Incomplete  Urine culture     Status: None   Collection Time: 03/11/15  9:02 PM  Result Value Ref Range Status   Specimen Description URINE, CLEAN CATCH  Final   Special Requests NONE  Final   Culture NO GROWTH 2 DAYS  Final   Report Status 03/13/2015 FINAL  Final    Anti-infectives    Start      Dose/Rate Route Frequency Ordered Stop   03/15/15 1500  vancomycin (VANCOCIN) IVPB 750 mg/150 ml premix     750 mg 150 mL/hr over 60 Minutes Intravenous Every 18 hours 03/15/15 1455     03/14/15 1000  levofloxacin (LEVAQUIN) IVPB 750 mg     750 mg 100 mL/hr over 90 Minutes Intravenous Every 48 hours 03/12/15 0132     03/13/15 1500  vancomycin (VANCOCIN) 500 mg in sodium chloride 0.9 % 100 mL IVPB  Status:  Discontinued     500 mg 100 mL/hr over 60 Minutes Intravenous Every 24 hours 03/12/15 1612 03/15/15 1455   03/12/15 2000  ceFEPIme (MAXIPIME) 2 g in dextrose 5 % 50 mL IVPB     2 g 100 mL/hr over 30 Minutes Intravenous Every 24 hours 03/12/15 1414     03/12/15 1500  vancomycin (VANCOCIN) 500 mg in sodium chloride 0.9 % 100 mL IVPB  Status:  Discontinued     500 mg 100 mL/hr over 60 Minutes Intravenous Every 36 hours 03/12/15 0427 03/12/15 1612   03/12/15 1400  aztreonam (AZACTAM) injection 1 g  Status:  Discontinued     1 g Intravenous 3 times per day 03/12/15 1052 03/12/15 1104   03/12/15 1200  aztreonam (AZACTAM) 1 g in dextrose 5 %  50 mL IVPB  Status:  Discontinued     1 g 100 mL/hr over 30 Minutes Intravenous 3 times per day 03/12/15 1104 03/12/15 1414   03/12/15 0815  aztreonam (AZACTAM) injection 1 g  Status:  Discontinued     1 g Intramuscular 3 times per day 03/12/15 0801 03/12/15 1052   03/12/15 0743  fluconazole (DIFLUCAN) tablet 100 mg     100 mg Oral Daily PRN 03/12/15 0743     03/12/15 0145  vancomycin (VANCOCIN) 500 mg in sodium chloride 0.9 % 100 mL IVPB     500 mg 100 mL/hr over 60 Minutes Intravenous  Once 03/12/15 0130 03/12/15 0406   03/12/15 0145  levofloxacin (LEVAQUIN) IVPB 750 mg     750 mg 100 mL/hr over 90 Minutes Intravenous  Once 03/12/15 0132 03/12/15 0543      Assessment: 81 yo female being treated for PNA.   Goal of Therapy:  Vancomycin trough level 15-20 mcg/ml  Plan:  Cefepime: Will continue Cefepime 2 g IV q 24 hours.   Levofloxacin: Will  continue patient on levofloxacin 750mg  IV Q48hr.    Vacnomycin: Vancomycin trough level resulted @ 7 mcg/ml on Vancomycin 500 mg IV q24 hours. Will change to Vancomycin 750 mg IV q18 hours and will order Vancomycin trough level prior to the 21:00 dose on 5/24.   Will monitor cultures and recommend narrowing as appropriate.   Pharmacy will continue to monitor and adjust per consult.    Expected duration 7 days with resolution of temperature and/or normalization of WBC  Michelle Webb D 03/15/2015,2:56 PM

## 2015-03-15 NOTE — Progress Notes (Signed)
[facility] Hematology/Oncology Progress Note  Date of admission: 03/11/2015  Hospital day:  03/15/2015  Chief Complaint: Michelle Webb is a 81 y.o. female with a history of gastric cancer who was admitted with increasing confusion, leukocytosis, and bibasilar pneumonia.  Subjective: The patient denies any complaint.  She states that she "feels great".  On further questioning, she states "I'll be ok".  Her daughter-in-law notes that she awoke today feeling good.  Trying to work with the bed pan, wore her out.  She recognized family members today.  Social History: The patient is accompanied by her sister-in-law Joan.  Allergies:  Allergies  Allergen Reactions  . Aspirin Nausea Only  . Clarithromycin Other (See Comments)    Reaction:  Unknown   . Propoxyphene Other (See Comments)    Reaction:  Unknown   . Penicillins Rash    Scheduled Medications: . ALPRAZolam  0.25 mg Oral TID  . ceFEPime (MAXIPIME) IV  2 g Intravenous Q24H  . fentaNYL  75 mcg Transdermal Q72H  . levofloxacin (LEVAQUIN) IV  750 mg Intravenous Q48H  . megestrol  400 mg Oral BID  . metoprolol tartrate  12.5 mg Oral BID  . nystatin  5 mL Oral QID  . phenazopyridine  100 mg Oral TID  . polyethylene glycol  17 g Oral Daily  . potassium chloride  20 mEq Oral Daily  . ranitidine  150 mg Oral BID  . sertraline  50 mg Oral Daily  . sodium chloride  3 mL Intravenous Q12H  . vancomycin  500 mg Intravenous Q24H   Review of Systems: GENERAL:  Feels "great". Bedridden.  No fevers. PERFORMANCE STATUS (ECOG):  4 HEENT:  Hard of hearing.  Denies any concerns. Lungs: Denies shortness of breath.. Cardiac:  No chest pain or palpitations.  By report, atrial fibrillation rate controlled. GI:  Minimal intake.  Took Xanax with apple sauce. No nausea, vomiting, melena or hematochezia. GU:  No urgency, frequency, dysuria, or hematuria. Musculoskeletal:  Denies pain. Extremities:  No pain or swelling. Skin:   No rashes or skin changes. Neuro:  General weakness. No focal weakness. Endocrine:  No diabetes or thyroid issues. Psych:  No mood changes, depression or anxiety. Pain:  Denies any focal pain. Review of systems:  All other systems reviewed and found to be negative.  Physical Exam: Blood pressure 112/50, pulse 86, temperature 97.7 F (36.5 C), temperature source Oral, resp. rate 16, height 5' (1.524 m), weight 80 lb 6.8 oz (36.48 kg), SpO2 95 %.  GENERAL:  Cachectic woman lying on the medical unit inno acute distress.  She sleeps throughout most of the evaluation HEAD:  Disheveled hair.  Normocephalic, atraumatic, face symmetric, no Cushingoid features. EYES:  Blue eyes.  Pupils equal round and reactive to light and accomodation.  No conjunctivitis or scleral icterus. ENT:  Oropharynx dry.   RESPIRATORY:  Poor respiratory excursion.  Decreased breath sounds at the bases. No wheezes or rhonchi. CARDIOVASCULAR:  Regular rate and rhythm without murmur, rub or gallop. ABDOMEN:  Scaphoid.  Soft, non-tender, with active bowel sounds, and no hepatosplenomegaly.  No masses. SKIN:  No rashes, ulcers or lesions. EXTREMITIES: Thin.  No edema, no skin discoloration or tenderness.  No palpable cords. LYMPH NODES: No palpable cervical, supraclavicular, axillary or inguinal adenopathy  PSYCH:  Sleepy but arousable.  Results for orders placed or performed during the hospital encounter of 03/11/15 (from the past 48 hour(s))  TSH     Status: None     Collection Time: 03/14/15  4:46 AM  Result Value Ref Range   TSH 1.018 0.350 - 4.500 uIU/mL  Basic metabolic panel     Status: Abnormal   Collection Time: 03/14/15  4:46 AM  Result Value Ref Range   Sodium 134 (L) 135 - 145 mmol/L   Potassium 4.4 3.5 - 5.1 mmol/L   Chloride 104 101 - 111 mmol/L   CO2 23 22 - 32 mmol/L   Glucose, Bld 78 65 - 99 mg/dL   BUN 24 (H) 6 - 20 mg/dL   Creatinine, Ser 0.85 0.44 - 1.00 mg/dL   Calcium 7.5 (L) 8.9 - 10.3 mg/dL    GFR calc non Af Amer >60 >60 mL/min   GFR calc Af Amer >60 >60 mL/min    Comment: (NOTE) The eGFR has been calculated using the CKD EPI equation. This calculation has not been validated in all clinical situations. eGFR's persistently <60 mL/min signify possible Chronic Kidney Disease.    Anion gap 7 5 - 15  Basic metabolic panel     Status: Abnormal   Collection Time: 03/15/15  6:00 AM  Result Value Ref Range   Sodium 135 135 - 145 mmol/L   Potassium 4.8 3.5 - 5.1 mmol/L   Chloride 106 101 - 111 mmol/L   CO2 26 22 - 32 mmol/L   Glucose, Bld 93 65 - 99 mg/dL   BUN 18 6 - 20 mg/dL   Creatinine, Ser 0.73 0.44 - 1.00 mg/dL   Calcium 7.6 (L) 8.9 - 10.3 mg/dL   GFR calc non Af Amer >60 >60 mL/min   GFR calc Af Amer >60 >60 mL/min    Comment: (NOTE) The eGFR has been calculated using the CKD EPI equation. This calculation has not been validated in all clinical situations. eGFR's persistently <60 mL/min signify possible Chronic Kidney Disease.    Anion gap 3 (L) 5 - 15  Platelet count     Status: None   Collection Time: 03/15/15  6:00 AM  Result Value Ref Range   Platelets 261 150 - 440 K/uL   No results found.  Assessment:  [name] is a 81 y.o. female with a history of gastric cancer admitted with increasing confusion, leukocytosis, and bibasilar pneumonia.  Family member notes comfort care decision today.  Patient denies any complaint.  Inquiring about going home.  Performance status 4.  Plan: 1. Supportive care.   2. Code status- DNR.  Melissa C Corcoran, MD  03/15/2015, 2:19 PM  

## 2015-03-16 ENCOUNTER — Ambulatory Visit: Payer: Medicare Other | Admitting: Oncology

## 2015-03-16 DIAGNOSIS — J9601 Acute respiratory failure with hypoxia: Secondary | ICD-10-CM | POA: Insufficient documentation

## 2015-03-16 DIAGNOSIS — I10 Essential (primary) hypertension: Secondary | ICD-10-CM

## 2015-03-16 DIAGNOSIS — J189 Pneumonia, unspecified organism: Secondary | ICD-10-CM | POA: Insufficient documentation

## 2015-03-16 DIAGNOSIS — IMO0001 Reserved for inherently not codable concepts without codable children: Secondary | ICD-10-CM | POA: Insufficient documentation

## 2015-03-16 LAB — CREATININE, SERUM
Creatinine, Ser: 0.72 mg/dL (ref 0.44–1.00)
GFR calc Af Amer: >60 mL/min (ref >60)

## 2015-03-16 MED ORDER — HEPARIN SOD (PORK) LOCK FLUSH 10 UNIT/ML IV SOLN
10.0000 [IU] | Freq: Once | INTRAVENOUS | Status: AC
  Administered 2015-03-16: 10 [IU]
  Filled 2015-03-16: qty 1

## 2015-03-16 MED ORDER — HYDROMORPHONE HCL 1 MG/ML IJ SOLN
0.2500 mg | INTRAMUSCULAR | Status: DC | PRN
  Administered 2015-03-16 (×3): 0.5 mg via INTRAVENOUS
  Filled 2015-03-16 (×4): qty 1

## 2015-03-16 MED ORDER — FENTANYL 75 MCG/HR TD PT72: 75.0000 ug | MEDICATED_PATCH | TRANSDERMAL | Status: AC

## 2015-03-16 MED ORDER — HYDROMORPHONE HCL 1 MG/ML IJ SOLN: 0.2500 mg | INTRAMUSCULAR | Status: AC | PRN

## 2015-03-16 MED ORDER — HEPARIN SOD (PORK) LOCK FLUSH 100 UNIT/ML IV SOLN
500.0000 [IU] | Freq: Once | INTRAVENOUS | Status: DC
  Filled 2015-03-16 (×2): qty 5

## 2015-03-16 MED ORDER — ALPRAZOLAM 0.25 MG PO TABS: 0.2500 mg | ORAL_TABLET | Freq: Three times a day (TID) | ORAL | Status: AC

## 2015-03-16 MED ORDER — LORAZEPAM 0.5 MG PO TABS: 0.5000 mg | ORAL_TABLET | ORAL | Status: AC | PRN

## 2015-03-16 NOTE — Progress Notes (Addendum)
[place] referral received following a Palliative Care consult. Mrs. Michelle Webb is an 81 year old woman admitted to [facility] through the ED for evaluation of confusion, shortness of breath and chest pain. She was found to be hypoxic. CXR revealed bilateral pleural effusions, patient had undergone thoracentesis 2 days prior to hospital admission.  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 Patient has continued to decline despite medical interventions, family has chosen to pursue comfort at the hospice home. Writer met with patient's son Michelle Webb out side of the patient's room to initiated education regarding hospice home services, philosophy and team approach to care with good understanding voiced by Michelle Webb. Consents signed, and faxed along with patient information to referral intake. Writer attempted visit after meeting with Michelle Webb, patient appeared to be sleeping with family at bedside, did not awaken. Report called to hospice home, EMS called. RN, MD, CSW, CM and family all aware of and in agreement with transport via EMS to the [facility] with portable DNR in place. Thank you for the opportunity to serve Mrs. Venables and her family. Michelle Shanks RN, BSN, Michelle Webb and [facility] [place] [phone] c

## 2015-03-16 NOTE — Discharge Summary (Signed)
Junelle Hashemi [place], 81 y.o., DOB 01-29-1932, MRN [id number]. Admission date: 03/11/2015 Discharge Date 03/16/2015 Primary MD [facility], Chrissie Noa, MD Admitting Physician Harrie Foreman, MD  Admission Diagnosis  Healthcare-associated pneumonia [J18.9] Acute respiratory failure with hypoxia [J96.01] Sepsis, due to unspecified organism [A41.9]  Discharge Diagnosis   Active Problems:   Pneumonia    Past Medical History  Diagnosis Date  . Cancer     stomach cancer  . Breast cancer     Past Surgical History  Procedure Laterality Date  . Mastectomy Bilateral   . Abdominal hysterectomy        Hospital Course See H&P, Labs, Consult and Test reports for all details in brief, patient was admitted for  acute respiratory failure. She was noted to have healthcare associated pneumonia. Patient was on broad-spectrum antibiotics, she continued doing very poorly she continued to have significant amount of chest pain and agitation. She does have history of stomach cancer with metastatic to the lungs.. Due to her failure to improve a palliative care consult and oncology consult was obtained. Patient was aggressively treated with antibiotics with no significant improvement in her condition. Finally family decided to make her comfort care and she is currently being transferred to the hospice home.  Active Problems:   Pneumonia    Consults  oncology  Significant Tests:  See full reports for all details    Dg Chest 1 View  03/09/2015   CLINICAL DATA:  Bilateral pleural effusions, post left thoracentesis  EXAM: CHEST  1 VIEW  COMPARISON:  02/26/2015  FINDINGS: No pneumothorax. Residual pleural effusions left greater than right. Bibasilar consolidation/atelectasis left greater than right, with left infrahilar air bronchograms evident. Stable cardiomegaly. Stable left IJ power port.  IMPRESSION: 1. No pneumothorax post thoracentesis, with residual pleural effusions and bibasilar consolidation/atelectasis, left  greater than right.   Electronically Signed   By: Lucrezia Europe M.D.   On: 03/09/2015 12:07   Ct Head W Wo Contrast  03/02/2015   CLINICAL DATA:  81 year old female with history of stomach cancer 2012 post chemotherapy. Presenting with headache over the past 2 weeks. History of falls over the past 10 weeks. Denies loss of consciousness. Initial encounter.  EXAM: CT HEAD WITHOUT AND WITH CONTRAST  TECHNIQUE: Contiguous axial images were obtained from the base of the skull through the vertex without and with intravenous contrast  CONTRAST:  94mL OMNIPAQUE IOHEXOL 300 MG/ML  SOLN  COMPARISON:  10/07/2014 and 09/30/2013 head CT.  FINDINGS: No intracranial hemorrhage.  No intracranial mass or abnormal enhancement.  Mild atrophy without hydrocephalus.  Mild small vessel disease type changes without CT evidence of large acute infarct.  No worrisome calvarial abnormality (lucency left paracentral frontal region unchanged and felt to be an arachnoid granulation). Mastoid air cells, middle ear cavities and paranasal sinuses are clear.  Orbital structures unremarkable.  Vascular calcifications.  IMPRESSION: No evidence of intracranial metastatic disease, hemorrhage or infarct.   Electronically Signed   By: Genia Del M.D.   On: 03/02/2015 10:37   Nm Pet Image Restag (ps) Skull Base To Thigh  02/27/2015   CLINICAL DATA:  Subsequent treatment strategy for gastric cancer.  EXAM: NUCLEAR MEDICINE PET SKULL BASE TO THIGH  TECHNIQUE: 12.6 mCi F-18 FDG was injected intravenously. Full-ring PET imaging was performed from the skull base to thigh after the radiotracer. CT data was obtained and used for attenuation correction and anatomic localization.  FASTING BLOOD GLUCOSE:  Value: 81 mg/dl  COMPARISON:  PET-CT 05/26/2014  FINDINGS: NECK  No hypermetabolic lymph nodes in the neck.  Muscular activity noted.  CHEST  Large bilateral pleural effusions are noted. Dense airspace consolidation noted in the left lower lobe. This is  hypermetabolic with SUV max of 7.8. There is surrounding marked interstitial thickening and vague nodularity with a fairly sharp margin anteriorly. This could be radiation change or infection. Tumor is on likely.  Dense area of airspace consolidation in the right upper lobe with surrounding marked interstitial thickening again with fairly straight margins suspicious for radiation change. This could also be infection. Tumor is felt to be unlikely.  There is a moderate size right pleural effusion with overlying atelectasis. Vague density in the right upper lobe posteriorly is hypermetabolic with SUV max of 4.3. This has the appearance of airspace disease and could be an infiltrate.  Nodular density in the lingula is also hypermetabolic with SUV max of 4.5. Again this is most likely infectious.  There are metabolically active mediastinal lymph nodes. A subcarinal node has an SUV max of 4.47. A left hilar node is 2.83.  ABDOMEN/PELVIS  No abnormal hypermetabolic activity within the liver, pancreas, adrenal glands, or spleen. No hypermetabolic lymph nodes in the abdomen or pelvis. No abnormal metabolic activity in the stomach to suggest recurrent tumor.  SKELETON  There are multiple metabolically active left ribs. I do not see obvious fractures. This could be radiation related also.  IMPRESSION: 1. Extensive bilateral lung disease with moderate to large bilateral pleural effusion and what appear to be bilateral infiltrates and/or radiation changes. Thoracentesis may be helpful for diagnostic purposes. Otherwise, a short-term followup post treatment chest CT may be helpful to reassess. 2. Metabolically active mediastinal and left hilar lymph nodes may be reactive changes due to the extensive lung disease. 3. Metabolically active left ribs without obvious lesion or fracture. Possible radiation changes. 4. No findings in the abdomen/pelvis to suggest recurrent gastric cancer or metastatic disease.   Electronically Signed    By: Marijo Sanes M.D.   On: 02/27/2015 15:09   Dg Chest Port 1 View  03/11/2015   CLINICAL DATA:  Weakness, confusion, fever and difficulty breathing. Gastric cancer with pleural effusions. Post thoracentesis 2 days prior.  EXAM: PORTABLE CHEST - 1 VIEW  COMPARISON:  Chest radiograph 03/09/2015  FINDINGS: Tip of the right chest port in the mid SVC. Lower lung volumes from prior. Cardiomegaly is unchanged allowing for differences in technique. Bilateral pleural effusions with bibasilar airspace disease, left greater than right. This is unchanged from prior exam. There is no pneumothorax. No pulmonary edema.  IMPRESSION: Lower lung volumes with unchanged bilateral pleural effusions and associated bibasilar airspace disease. No pneumothorax or significant change from prior.   Electronically Signed   By: Jeb Levering M.D.   On: 03/11/2015 21:36   Dg Chest Port 1 View  02/26/2015   CLINICAL DATA:  Chest pain radiating neck since 4 p.m.  EXAM: PORTABLE CHEST - 1 VIEW  COMPARISON:  Radiograph 08/01/2014  FINDINGS: Right power port unchanged. Stable cardiac silhouette. There is new bibasilar airspace disease greater on the left. There small bilateral pleural effusion. Lungs are hyperinflated.  IMPRESSION: 1. Bibasilar airspace disease representing asymmetric edema versus bibasilar pneumonia. Airspace disease greater in the left lower lobe.  2.  Bilateral small effusions.   Electronically Signed   By: Suzy Bouchard M.D.   On: 02/26/2015 19:37   US Thoracentesis Asp Pleural Space W/img Guide  03/09/2015   CLINICAL DATA:  Respiratory compromise. Bilateral  pleural effusions. Gastric carcinoma.  EXAM: EXAM THORACENTESIS WITH ULTRASOUND  TECHNIQUE: The procedure, risks (including but not limited to bleeding, infection, organ damage ), benefits, and alternatives were explained to the patient. Questions regarding the procedure were encouraged and answered. The patient understands and consents to the procedure.  Survey ultrasound of the left hemithorax was performed and an appropriate skin entry site was localized. Site was marked, prepped with chlorhexidine, draped in usual sterile fashion, infiltrated locally with 1% lidocaine.  The Saf-T-Centesis needle was advanced into the pleural space. Clear yellow fluid returned. Aspiration was terminated Because of patient's complaint chest Pain radiating toward the shoulder which persisted despite suspending the aspiration. 700 mL was removed. Sample sent for the requested laboratory studies. The patient tolerated procedure well.  COMPLICATIONS: COMPLICATIONS None immediate  IMPRESSION: Technically successful ultrasound-guided left thoracentesis. Follow-up chest radiograph pending.   Electronically Signed   By: Lucrezia Europe M.D.   On: 03/09/2015 11:59       Today   Subjective:   Markella Dao continues to do a poorly plan is for her to go to hospice home  Objective:   Blood pressure 112/45, pulse 84, temperature 98.4 F (36.9 C), temperature source Oral, resp. rate 20, height 5' (1.524 m), weight 37.966 kg (83 lb 11.2 oz), SpO2 94 %.  .  Intake/Output Summary (Last 24 hours) at 03/16/15 1418 Last data filed at 03/16/15 1000  Gross per 24 hour  Intake 1898.75 ml  Output   1250 ml  Net 648.75 ml    Exam VITAL SIGNS: Blood pressure 112/45, pulse 84, temperature 98.4 F (36.9 C), temperature source Oral, resp. rate 20, height 5' (1.524 m), weight 37.966 kg (83 lb 11.2 oz), SpO2 94 %.  GENERAL:  81 y.o.-year-old patient lying in the bed appears comfortable  EYES: Pupils equal, round, reactive to light and accommodation. No scleral icterus. Extraocular muscles intact.  HEENT: Head atraumatic, normocephalic. Oropharynx and nasopharynx clear.  NECK:  Supple, no jugular venous distention. No thyroid enlargement, no tenderness.  LUNGS: Normal breath sounds bilaterally, no wheezing, rales,rhonchi or crepitation. No use of accessory muscles of respiration.   CARDIOVASCULAR: S1, S2 normal. No murmurs, rubs, or gallops.  ABDOMEN: Soft, nontender, nondistended. Bowel sounds present. No organomegaly or mass.  EXTREMITIES: No pedal edema, cyanosis, or clubbing.  NEUROLOGIC:Lethargic  PSYCHIATRIC:Lethargic  SKIN: No obvious rash, lesion, or ulcer.   Data Review   Cultures -   CBC w Diff: Lab Results  Component Value Date   WBC 10.5 03/12/2015   WBC 6.6 02/19/2015   HGB 8.8* 03/12/2015   HGB 10.2* 02/19/2015   HCT 27.3* 03/12/2015   HCT 30.4* 02/19/2015   PLT 261 03/15/2015   PLT 183 02/19/2015   LYMPHOPCT 5 03/11/2015   LYMPHOPCT 6.6 02/19/2015   MONOPCT 7 03/11/2015   MONOPCT 6.7 02/19/2015   EOSPCT 0 03/11/2015   EOSPCT 0.2 02/19/2015   BASOPCT 0 03/11/2015   BASOPCT 0.1 02/19/2015   CMP: Lab Results  Component Value Date   NA 135 03/15/2015   NA 132* 02/19/2015   K 4.8 03/15/2015   K 2.6* 02/19/2015   CL 106 03/15/2015   CL 97* 02/19/2015   CO2 26 03/15/2015   CO2 28 02/19/2015   BUN 18 03/15/2015   BUN 15 02/19/2015   CREATININE 0.72 03/16/2015   CREATININE 0.82 02/19/2015   PROT 6.4* 03/11/2015   PROT 6.5 02/19/2015   ALBUMIN 2.1* 03/11/2015   ALBUMIN 3.0* 02/19/2015   BILITOT 0.5  03/11/2015   ALKPHOS 122 03/11/2015   ALKPHOS 88 02/19/2015   AST 23 03/11/2015   AST 40 02/19/2015   ALT 16 03/11/2015   ALT 19 02/19/2015  .  Micro Results Recent Results (from the past 240 hour(s))  Blood Culture (routine x 2)     Status: None (Preliminary result)   Collection Time: 03/11/15  9:02 PM  Result Value Ref Range Status   Specimen Description BLOOD  Final   Special Requests NONE  Final   Culture NO GROWTH 4 DAYS  Final   Report Status PENDING  Incomplete  Blood Culture (routine x 2)     Status: None (Preliminary result)   Collection Time: 03/11/15  9:02 PM  Result Value Ref Range Status   Specimen Description BLOOD  Final   Special Requests NONE  Final   Culture NO GROWTH 4 DAYS  Final   Report Status  PENDING  Incomplete  Urine culture     Status: None   Collection Time: 03/11/15  9:02 PM  Result Value Ref Range Status   Specimen Description URINE, CLEAN CATCH  Final   Special Requests NONE  Final   Culture NO GROWTH 2 DAYS  Final   Report Status 03/13/2015 FINAL  Final     Discharge Instructions        Discharge Medications     Medication List    STOP taking these medications        ciprofloxacin 250 MG tablet  Commonly known as:  CIPRO     fentaNYL 50 MCG/HR  Commonly known as:  DURAGESIC - dosed mcg/hr  Replaced by:  fentaNYL 75 MCG/HR     fluconazole 100 MG tablet  Commonly known as:  DIFLUCAN     lisinopril 5 MG tablet  Commonly known as:  PRINIVIL,ZESTRIL     omeprazole 20 MG capsule  Commonly known as:  PRILOSEC     phenazopyridine 100 MG tablet  Commonly known as:  PYRIDIUM     potassium chloride 20 MEQ packet  Commonly known as:  KLOR-CON     sertraline 50 MG tablet  Commonly known as:  ZOLOFT      TAKE these medications        ALPRAZolam 0.25 MG tablet  Commonly known as:  XANAX  Take 1 tablet (0.25 mg total) by mouth 3 (three) times daily.     fentaNYL 75 MCG/HR  Commonly known as:  DURAGESIC - dosed mcg/hr  Place 1 patch (75 mcg total) onto the skin every 3 (three) days.     HYDROmorphone 1 MG/ML injection  Commonly known as:  DILAUDID  Inject 0.25-0.5 mLs (0.25-0.5 mg total) into the vein every 30 (thirty) minutes as needed for severe pain.     LORazepam 0.5 MG tablet  Commonly known as:  ATIVAN  Take 1 tablet (0.5 mg total) by mouth every 4 (four) hours as needed for anxiety.     ondansetron 4 MG tablet  Commonly known as:  ZOFRAN  Take 4 mg by mouth every 8 (eight) hours as needed for nausea or vomiting.      ASK your doctor about these medications        diphenhydrAMINE 25 mg capsule  Commonly known as:  BENADRYL  Take 25 mg by mouth at bedtime as needed for sleep.         Total Time in preparing paper work, data  evaluation and todays exam - 49 minutes  Dustin Flock M.D on 03/16/2015  at [facility]  [facility]   Office  [phone]

## 2015-03-16 NOTE — Consult Note (Signed)
Chief Complaint/Diagnosis:   1. Anemia normocytic, normochromic. 2. Vitamin B 12 deficiency. Iron deficiency. 3. Carcinoma of stomach invasive adenocarcinoma.  Diagnosed by upper endoscopy in August of 2012. based on EUS. and a PET scan patient has T3, N2, M1, disease, stage IV 4. Carboplatinum and Taxol,started in September of 2012. HER-2/neu receptor by IHC is 3+. 5. Herceptin added from October of 2012. 6. Herceptin has been put on hold to give  patient some chemotherapy holidays.November of 2014 7. Recent upper endoscopy as well as CT scan shows no evidence of malignancy(January, 20117) HPI:   81 year old lady whose condition has been declining.  Having increasing pain.  Poor appetite declining performance status patient is somewhat confused disoriented.  Pain is in epigastric area abdominal area.  After several days of antibiotics patient's condition did not improve.  Family by the side of bed.   Review of Systems:  General: weakness  Fatigue Recent is getting more and more confused and disoriented with frequent fall   Performance Status (ECOG): 3  HEENT: no complaints  Lungs: Increasing chest pain.  Increasing shortness of breath hypoxia and a recent thoracentesis.    Cardiac: Increasing shortness of breath   GU: no complaints  Musculoskeletal: no complaints  Extremities: no complaints  Skin: no complaints  Neuro: Patient is increasing confusion and disorientation had evaluation by neurologist and CT scan of brain was noncontributory   Psych: anxiety  depression  Pain: Persistent chest pain in the left side.    Review of Systems: All other systems were reviewed and found to be negative    GI: Poor appetite.  Increasing difficulty swallowing.  Possible aspiration.  Allergies:  Penicillin: N/V/Diarrhea, GI Distress  Darvon: Hallucinations  Biaxin: Swelling  Aspirin: GI Distress  Significant History/PMH:   Previous blood transfusion:    Anemia:    Heart Murmer:     Stomach Cancer:    COPD:    GERD - Esophageal Reflux:    Hypertension:    Breast Cancer: 40 years ago   Adenocarcinoma: stomach, Aug 2012   Dental Implants:    Oral Surgery: Multiple. Lower jaw replacement.   Mastectomy, bilateral:    hysterectomy and bladder tac:   Smoking History: Smoking History Never Smoked.(1)  PFSH: Comments: Significant family history of ovarian cancer and of breast cancer and colon cancer  Comments: Patient does not smoke does not drink  Additional Past Medical and Surgical History: History of breast cancer 30 years ago.  Had bilateral mastectomy.  No chemotherapy or radiation treatment.  osteo arthritis  Aortic valve  disorder  Rheumatoid fever in childhood  Depression   COPD   Esophageal dilatation   Facial reconstruction in 1994   Home Medications: Medication Instructions Last Modified Date/Time  fentaNYL 25 mcg/hr transdermal film, extended release 1 patch transdermal every 72 hours 28-Apr-16 16:07  CeleXA 20 mg oral tablet 1 tab(s) orally once a day 28-Apr-16 15:14  potassium chloride 20 mEq oral powder for reconstitution 1 each orally once a day 28-Apr-16 15:14  LORazepam 1 mg oral tablet 1 tab(s) orally once 1 hour prior to MRI 28-Apr-16 15:14  Diflucan 100 mg tablet 1 tab(s) orally once a day x 7 days 28-Apr-16 15:14  guaiFENesin 600 mg oral tablet, extended release 1 tab(s) orally every 12 hours 28-Apr-16 15:14  ondansetron 4 mg oral tablet, disintegrating 1 tab(s) orally 3 times a day, As Needed  28-Apr-16 15:14  gabapentin 100 mg oral capsule 2 cap(s) orally 2 times a day 28-Apr-16 15:14  lisinopril 5 mg oral tablet 1 tab(s) orally once a day 28-Apr-16 15:14  ALPRAZolam 0.25 mg oral tablet 1 tab(s) orally 3 times a day, As Needed - for Anxiety, Nervousness 28-Apr-16 15:14  K-Sol 40 mEq/15 mL oral liquid 15 milliliter(s) orally once, take with dinner tonight 28-Apr-16 15:14  clotrimazole 1% 1 applicatorful vaginal once a day (at  bedtime), As Needed 28-Apr-16 15:14  promethazine 25 mg rectal suppository 1 SUPP(s) rectal every 6 hours, As Needed 28-Apr-16 15:14  Tylenol 500 mg oral tablet 1 tab(s) orally every 6 hours, As Needed - for Pain 28-Apr-16 15:14  diphenhydrAMINE 25 mg oral capsule 1 cap(s) orally once a day (at bedtime), As Needed 28-Apr-16 15:14  omeprazole 20 mg oral delayed release capsule 1 cap(s) orally 2 times a day 28-Apr-16 15:14  Colace 100 mg oral capsule 1 cap(s) orally 2 times a day 28-Apr-16 15:14   Vital Signs:  :: Ht(CM): 152.4 Wt(KG): 35.7 BSA: 1.2 Temp: 98.5 Pulse: 71 RR: 18  BP: 175/69   Physical Exam:  General: cachectic patient was in mild distress because of pain   Mental Status: apprehensive  Eyes: pupils  are equal reacting to light.    No jaundice  palelooking sclera  Head, Ears, Nose,Throat: normal, no lesions or deformities  Neck, Thyroid: no thyroid tenderness, enlargement or nodule.  neck supple without massess or tenderness. no adenopathy.  Respiratory: lungs: Diminished air  entry.  no  rhonchi.  No rales.  Cardiovascular: ttachycardia  Gastrointestinal: abdomen is soft.  Liver and spleen not palpable.  No ascites.  No tenderness.  Bowel sounds are normal  Musculoskeletal: no muscular asymmetry noted.  no swelling or tenderness of joints.  ROM upper and lower extremities normal  Skin: no rash.  No ecchymosis.no skin lesions.  Neurological: Patient is disoriented but no focal signs  cranial nerves are intact  Muscle power tone within normal limit.  No focal signs.  No sensory deficit.  Lymphatics: no cervical, axillary, or inguinal lymphadenopathy  Lab data has been reviewed leucocytrosis , Anemia.  Chest x-ray shows bilateral small pleural effusion which has been reviewed independently  c   Impression and plan Patient had a history of gastroesophageal junction malignancy Recent admission with progressive shortness of breath chest pain epigastric discomfort Several days  of intravenous antibiotic and no improvement   Plan Overall we are dealing with failure to thrive. Significant pain in epigastric area and further workup may not be possible at this point in time with upper endoscopy or a PET scan. I had prolonged discussion with patient 's .  Also palliative care is also evaluating patient. At present time it appears that: Of treatments would be comfort care with control pain in this elderly lady was condition has been declining for a while. I had 40 minutes of discussion with patient's family and total duration of visit was 87mnutes. Family agreed that patient can be transferred to hospice home for palliative care and comfort care. I discussed situation with palliative care team.  As well as Dr. PPosey Prontoand social service and hospice nurse.  Patient will get intravenous Toradol and Dilaudid prior to discharge for ambulance transfer to the hospice home. All lab data had been reviewed.

## 2015-03-16 NOTE — Progress Notes (Signed)
[facility] PROGRESS NOTE  Michelle Webb EXB:[id number] DOB: 27-Jan-1932 DOA: 03/11/2015 PCP: Sofie Hartigan, MD  HPI/Subjective:  Very uncomfortable according to son last night, he states he is waiting for Dr. Verdene Rio to say pt will not recover and then would  Want her to be complete comfort care.   Nurse spoke to Dr. Verdene Rio, he will see her at 15, son wants me to speak to him and communicate with this am.      Objective: Filed Vitals:   03/16/15 0426  BP: 112/45  Pulse: 84  Temp: 98.4 F (36.9 C)  Resp: 20    Intake/Output Summary (Last 24 hours) at 03/16/15 0857 Last data filed at 03/16/15 0600  Gross per 24 hour  Intake 1898.75 ml  Output   1000 ml  Net 898.75 ml   Filed Weights   03/14/15 0351 03/15/15 0449 03/16/15 0426  Weight: 36.469 kg (80 lb 6.4 oz) 36.48 kg (80 lb 6.8 oz) 37.966 kg (83 lb 11.2 oz)    ROS:  unable to obtain due to patient being drowsy  Exam: Physical Exam  Constitutional: She appears cachectic.  HENT:  Nose: No mucosal edema.  Mouth/Throat: No oropharyngeal exudate or posterior oropharyngeal edema.  Blood tinged dried from the right nostril. Thrush seen on tongue.  Eyes: Conjunctivae, EOM and lids are normal. Pupils are equal, round, and reactive to light.  Neck: No JVD present. Carotid bruit is not present. No edema present. No thyroid mass and no thyromegaly present.  Cardiovascular: S1 normal and S2 normal.  An irregularly irregular rhythm present. present.  Exam reveals no gallop.   No murmur heard. Pulses:      Dorsalis pedis pulses are 1+ on the right side, and 1+ on the left side.  Respiratory: No respiratory distress. She has decreased breath sounds in the right lower field and the left lower field. She has no wheezes. She has rhonchi in the right lower field and the left lower field. She has no rales.  GI: Soft. Bowel sounds are normal. There is tenderness in the epigastric area.  Lymphadenopathy:    She has no  cervical adenopathy.  Neurological: Very drowsy  Skin: Skin is warm. No rash noted. Nails show no clubbing.  Psychiatric: Drowsy   Data Reviewed: Basic Metabolic Panel:  Recent Labs Lab 03/11/15 2102 03/12/15 0820 03/13/15 1027 03/14/15 0446 03/15/15 0600 03/16/15 0523  NA 132*  --   --  134* 135  --   K 4.4  --   --  4.4 4.8  --   CL 99*  --   --  104 106  --   CO2 25  --   --  23 26  --   GLUCOSE 118*  --   --  78 93  --   BUN 30*  --   --  24* 18  --   CREATININE 0.93 0.67 0.71 0.85 0.73 0.72  CALCIUM 8.0*  --   --  7.5* 7.6*  --    Liver Function Tests:  Recent Labs Lab 03/11/15 2102  AST 23  ALT 16  ALKPHOS 122  BILITOT 0.5  PROT 6.4*  ALBUMIN 2.1*    Recent Labs Lab 03/11/15 2102  LIPASE 21*    CBC:  Recent Labs Lab 03/11/15 2102 03/12/15 0820 03/15/15 0600  WBC 12.3* 10.5  --   NEUTROABS 10.8*  --   --   HGB 9.5* 8.8*  --   HCT 28.7* 27.3*  --  MCV 92.1 91.6  --   PLT 291 246 261   Cardiac Enzymes:  Recent Labs Lab 03/11/15 2102  TROPONINI 0.04*   Studies: No results found.  Scheduled Meds: . ALPRAZolam  0.25 mg Oral TID  . ceFEPime (MAXIPIME) IV  2 g Intravenous Q24H  . fentaNYL  75 mcg Transdermal Q72H  . levofloxacin (LEVAQUIN) IV  750 mg Intravenous Q48H  . megestrol  400 mg Oral BID  . metoprolol tartrate  12.5 mg Oral BID  . nystatin  5 mL Oral QID  . phenazopyridine  100 mg Oral TID  . polyethylene glycol  17 g Oral Daily  . potassium chloride  20 mEq Oral Daily  . ranitidine  150 mg Oral BID  . sertraline  50 mg Oral Daily  . sodium chloride  3 mL Intravenous Q12H  . vancomycin  750 mg Intravenous Q18H   Continuous Infusions: . sodium chloride 75 mL/hr at 03/15/15 1900    Assessment/Plan:  1. Clinical sepsis, bibasilar pneumonia,patient is immunocompromised with history of cancer. Continue antibiotics for now likely will d/c soon once made comfort care.  2. Atrial fibrillation with rapid ventricular response.  Patient's heart rate is improved not a candidate for anticoagulation  3. Chest pain: IV  Morphine, Roxanol continue fentanyl patch. Palliative care consultation for pain management appreciated 4. 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 Dr. Oliva Bustard seen, prognosis poor Gastroesophageal reflux disease without esophagitis on Protonix. Due to poor by mouth intake I will start on Megace 5. Hypertension essential on lisinopril. 6. Depression on low dose Zoloft. 7. Thrush; continue Diflucan. 8. Anemia of chronic disease, likely further dilutional with IV fluid hydration. CODE STATUS  DNR. Once decided on dnr, move to 1c or hospices home  Code Status:     Code Status Orders        Start     Ordered   03/12/15 0758  Do not attempt resuscitation (DNR)   Continuous    Question Answer Comment  In the event of cardiac or respiratory ARREST Do not call a "code blue"   In the event of cardiac or respiratory ARREST Do not perform Intubation, CPR, defibrillation or ACLS   In the event of cardiac or respiratory ARREST Use medication by any route, position, wound care, and other measures to relive pain and suffering. May use oxygen, suction and manual treatment of airway obstruction as needed for comfort.   Comments nurse may pronounce      03/12/15 0758    Advance Directive Documentation        Most Recent Value   Type of Advance Directive  Healthcare Power of Attorney   Pre-existing out of facility DNR order (yellow form or pink MOST form)     "MOST" Form in Place?       Family Communication: Son on phone, family friend at bedside. Disposition Plan: To be determined  Consultants:  Palliative care  Oncology  Time spent: 30 minutes. Case discussed with nursing staff on the floor.  Posey Pronto [facility]

## 2015-03-16 NOTE — Consult Note (Signed)
Hospice Home Discharge Orders    Patient:  Michelle Webb is an 81 y.o., female MRN:  [id number] DOB:  1932-06-01 Patient phone:  [phone] (home)  Patient address:   [place] [address],     Admit: Admit to Hospice Home  Code Status: DNR  Diet: as tolerated  Activity: as tolerated  Oxygen: use for comfort  Foley: Leave foley cath for urinary incontinence/previous skin breakdown   Date of Admission:  03/11/2015   Principal Problem: <principal problem not specified> Discharge Diagnoses: Patient Active Problem List   Diagnosis Date Noted  . Pneumonia [J18.9] 03/12/2015      Medications: May crush or use liquid when appropriate. May change to rectal route if unable to swallow.    Medication List    STOP taking these medications        ciprofloxacin 250 MG tablet  Commonly known as:  CIPRO     fentaNYL 50 MCG/HR  Commonly known as:  DURAGESIC - dosed mcg/hr  Replaced by:  fentaNYL 75 MCG/HR     fluconazole 100 MG tablet  Commonly known as:  DIFLUCAN     lisinopril 5 MG tablet  Commonly known as:  PRINIVIL,ZESTRIL     omeprazole 20 MG capsule  Commonly known as:  PRILOSEC     phenazopyridine 100 MG tablet  Commonly known as:  PYRIDIUM     potassium chloride 20 MEQ packet  Commonly known as:  KLOR-CON     sertraline 50 MG tablet  Commonly known as:  ZOLOFT      TAKE these medications        ALPRAZolam 0.25 MG tablet  Commonly known as:  XANAX  Take 1 tablet (0.25 mg total) by mouth 3 (three) times daily.     fentaNYL 75 MCG/HR  Commonly known as:  DURAGESIC - dosed mcg/hr  Place 1 patch (75 mcg total) onto the skin every 3 (three) days.     HYDROmorphone 1 MG/ML injection  Commonly known as:  DILAUDID  Inject 0.25-0.5 mLs (0.25-0.5 mg total) into the vein every 30 (thirty) minutes as needed for severe pain.     LORazepam 0.5 MG tablet  Commonly known as:  ATIVAN  Take 1 tablet (0.5 mg total) by mouth every 4 (four)  hours as needed for anxiety.     ondansetron 4 MG tablet  Commonly known as:  ZOFRAN  Take 4 mg by mouth every 8 (eight) hours as needed for nausea or vomiting.      ASK your doctor about these medications        diphenhydrAMINE 25 mg capsule  Commonly known as:  BENADRYL  Take 25 mg by mouth at bedtime as needed for sleep.           Comments: Pt allergic to morphine (hallucinations), pt symptoms have been controlled with 17mcg fent patch and PRN dilaudid  Family would like for [place] not to mention hospice home to pt    Signed: Maudry Mayhew 03/16/2015, 1:37 PM

## 2015-03-16 NOTE — Care Management (Signed)
Patient is for transfer to [facility]

## 2015-03-16 NOTE — Consult Note (Signed)
Palliative Medicine Inpatient Consult Follow Up Note   Name: Michelle Webb Date: 03/16/2015 MRN: [id number]  DOB: Dec 04, 1931  Referring Physician: Dustin Flock, MD  Palliative Care consult requested for this 81 y.o. female for goals of medical therapy in patient with with gastric cancer, h/o breast cancer (40 years ago), s/p bilateral mastectomies, COPD, GERD, and depression.  Pt with continued lethargy and decreased intake over weekend.  Family states pain has been well controlled on dilaudid.   REVIEW OF SYSTEMS:  Patient is not able to provide ROS  CODE STATUS: DNR   PAST MEDICAL HISTORY: Past Medical History  Diagnosis Date  . Cancer     stomach cancer  . Breast cancer     PAST SURGICAL HISTORY:  Past Surgical History  Procedure Laterality Date  . Mastectomy Bilateral   . Abdominal hysterectomy      Vital Signs: BP 112/45 mmHg  Pulse 84  Temp(Src) 98.4 F (36.9 C) (Oral)  Resp 20  Ht 5' (1.524 m)  Wt 37.966 kg (83 lb 11.2 oz)  BMI 16.35 kg/m2  SpO2 94% Filed Weights   03/14/15 0351 03/15/15 0449 03/16/15 0426  Weight: 36.469 kg (80 lb 6.4 oz) 36.48 kg (80 lb 6.8 oz) 37.966 kg (83 lb 11.2 oz)    Estimated body mass index is 16.35 kg/(m^2) as calculated from the following:   Height as of this encounter: 5' (1.524 m).   Weight as of this encounter: 37.966 kg (83 lb 11.2 oz).  PHYSICAL EXAM: Generall: cachectic HEENT: OP clear, moist oral mucosa Neck: Trachea midline  Cardiovascular: RRR Pulmonary/Chest: diminished and rales noted to ant fields Abdominal: Soft, NTTP. Hypoactive bowel sounds GU: No SP tenderness, No CVA tenderness Extremities: No edema  Neurological: Grossly nonfocal Skin: Warm, dry and intact.  Psychiatric: Calm, lethargic  LABS: CBC:    Component Value Date/Time   WBC 10.5 03/12/2015 0820   WBC 6.6 02/19/2015 1410   HGB 8.8* 03/12/2015 0820   HGB 10.2* 02/19/2015 1410   HCT 27.3* 03/12/2015 0820   HCT 30.4* 02/19/2015 1410   PLT 261 03/15/2015 0600   PLT 183 02/19/2015 1410   MCV 91.6 03/12/2015 0820   MCV 94 02/19/2015 1410   NEUTROABS 10.8* 03/11/2015 2102   NEUTROABS 5.7 02/19/2015 1410   LYMPHSABS 0.6* 03/11/2015 2102   LYMPHSABS 0.4* 02/19/2015 1410   MONOABS 0.9 03/11/2015 2102   MONOABS 0.4 02/19/2015 1410   EOSABS 0.0 03/11/2015 2102   EOSABS 0.0 02/19/2015 1410   BASOSABS 0.0 03/11/2015 2102   BASOSABS 0.0 02/19/2015 1410   Comprehensive Metabolic Panel:    Component Value Date/Time   NA 135 03/15/2015 0600   NA 132* 02/19/2015 1410   K 4.8 03/15/2015 0600   K 2.6* 02/19/2015 1410   CL 106 03/15/2015 0600   CL 97* 02/19/2015 1410   CO2 26 03/15/2015 0600   CO2 28 02/19/2015 1410   BUN 18 03/15/2015 0600   BUN 15 02/19/2015 1410   CREATININE 0.72 03/16/2015 0523   CREATININE 0.82 02/19/2015 1410   GLUCOSE 93 03/15/2015 0600   GLUCOSE 123* 02/19/2015 1410   CALCIUM 7.6* 03/15/2015 0600   CALCIUM 7.9* 02/19/2015 1410   AST 23 03/11/2015 2102   AST 40 02/19/2015 1410   ALT 16 03/11/2015 2102   ALT 19 02/19/2015 1410   ALKPHOS 122 03/11/2015 2102   ALKPHOS 88 02/19/2015 1410   BILITOT 0.5 03/11/2015 2102   PROT 6.4* 03/11/2015 2102   PROT 6.5 02/19/2015 1410  ALBUMIN 2.1* 03/11/2015 2102   ALBUMIN 3.0* 02/19/2015 1410    IMPRESSION:  Michelle Webb is an 81 y.o. female for goals of medical therapy in patient with gastric cancer, h/o breast cancer (40 years ago), s/p bilateral mastectomies, COPD, GERD, and depression. Pt presented to ER from home with SOB, confusion and back pain. ER workup significant for bilat pleural effusions, PNA?, and leukocytosis.   Pt with continued lethargy over weekend and minimal intake.  Family at bedside and states they would like comfort measures only as pt has not progressed despite aggressive antibiotics.Marland Kitchen  Spoke with son, Michelle Webb, at length about comfort measures.  Family would like to see Dr. Oliva Bustard before any transfer to hospice home, which s their  wishes for EOL care.    Please note pt has hallucinations with morphine and pt has been controlled on dilaudid.  Will continue.  PLAN: See above  REFERRALS TO BE ORDERED:  Hospice   More than 50% of the visit was spent in counseling/coordination of care: YES  Time Spent: 35 minutes

## 2015-03-17 LAB — CULTURE, BLOOD (ROUTINE X 2)
CULTURE: NO GROWTH
Culture: NO GROWTH

## 2015-03-22 ENCOUNTER — Encounter: Payer: Self-pay | Admitting: Oncology

## 2015-03-22 DIAGNOSIS — C169 Malignant neoplasm of stomach, unspecified: Secondary | ICD-10-CM | POA: Insufficient documentation

## 2015-03-22 DIAGNOSIS — I10 Essential (primary) hypertension: Secondary | ICD-10-CM | POA: Insufficient documentation

## 2015-03-22 NOTE — Progress Notes (Signed)
[place] @ [address] [facility] [phone]  Fax:([phone] [place]: 02/28/1932  MR#: [id number]  [facility]#:[id number]  Patient Care Team: Sofie Hartigan, MD as PCP - General (Family Medicine)  CHIEF COMPLAINT:  Chief Complaint  Patient presents with  . Follow-up    1. Anemia normocytic, normochromic. 2. Vitamin B 12 deficiency. Iron deficiency. 3. Carcinoma of stomach invasive adenocarcinoma.  Diagnosed by upper endoscopy in August of 2012. based on EUS. and a PET scan patient has T3, N2, M1, disease, stage IV 4. Carboplatinum and Taxol,started in September of 2012. HER-2/neu receptor by IHC is 3+. 5. Herceptin added from October of 2012. 6. Herceptin has been put on hold to give  patient some chemotherapy holidays.November of 2014 7. Recent upper endoscopy as well as CT scan shows no evidence of malignancy(January, 2015)  Oncology Flowsheet 03/13/2015 03/14/2015 03/14/2015 03/14/2015 03/15/2015 03/15/2015 03/16/2015  ALPRAZolam (XANAX) PO 0.25 mg 0.25 mg 0.25 mg 0.25 mg 0.25 mg 0.25 mg 0.25 mg  LORazepam (ATIVAN) IV - - - - 2 mg   2 mg  megestrol 400 MG/10ML (MEGACE) PO - 400 mg 400 mg   400 mg   -    INTERVAL HISTORY: 81 year old lady with history of GE junction adenocarcinoma.  Patient's condition is been gradually declining increasing shortness of breath poor appetite increasing chest pain.  No nausea.  No vomiting.  Patient has lost significant weight.  Has a frequent 4.  Getting more and more confused.  No fever.  REVIEW OF SYSTEMS:   Gen. status: Declining performance rigors.  Has lost significant weight.  HEENT: No headache.  No hearing loss.  No ear pain.  No nosebleed or congestion.  No sore throat.  No difficulty swallowing Lungs: Increasing shortness of breath.  Chest pain.  Poor appetite. Cardiac: No chest pain but chest wall pain on the right than the left side Lower extremity being GI: Persistent nausea losing weight and no diarrhea Neurological  system: Increasing confusing.  Disorientation.  Difficulty maintaining balance.  Frequent follow-up. Skin: No rash GU: No dysuria or hematuria  As per HPI. Otherwise, a complete review of systems is negatve.  PAST MEDICAL HISTORY: Past Medical History  Diagnosis Date  . Cancer     stomach cancer  . Breast cancer     PAST SURGICAL HISTORY: Past Surgical History  Procedure Laterality Date  . Mastectomy Bilateral   . Abdominal hysterectomy      FAMILY HISTORY Family History  Problem Relation Age of Onset  . Breast cancer Sister   . Ovarian cancer Mother   . Breast cancer Daughter     GYNECOLOGIC HISTORY:  No LMP recorded. Patient has had a hysterectomy.     ADVANCED DIRECTIVES:    HEALTH MAINTENANCE: History  Substance Use Topics  . Smoking status: Never Smoker   . Smokeless tobacco: Never Used  . Alcohol Use: No      Allergies  Allergen Reactions  . Aspirin Nausea Only  . Clarithromycin Other (See Comments)    Reaction:  Unknown   . Penicillin G Itching  . Propoxyphene Other (See Comments)    Reaction:  Unknown   . Penicillins Rash    Current Outpatient Prescriptions  Medication Sig Dispense Refill  . ALPRAZolam (XANAX) 0.25 MG tablet Take 1 tablet (0.25 mg total) by mouth 3 (three) times daily. 30 tablet 0  . ciprofloxacin (CIPRO) 250 MG tablet     . diphenhydrAMINE (BENADRYL) 25 mg capsule Take  25 mg by mouth at bedtime as needed for sleep.    . fentaNYL (DURAGESIC - DOSED MCG/HR) 50 MCG/HR     . fentaNYL (DURAGESIC - DOSED MCG/HR) 75 MCG/HR Place 1 patch (75 mcg total) onto the skin every 3 (three) days. 5 patch 0  . HYDROmorphone (DILAUDID) 1 MG/ML injection Inject 0.25-0.5 mLs (0.25-0.5 mg total) into the vein every 30 (thirty) minutes as needed for severe pain. 1 mL 0  . LORazepam (ATIVAN) 0.5 MG tablet Take 1 tablet (0.5 mg total) by mouth every 4 (four) hours as needed for anxiety. 30 tablet 0  . ondansetron (ZOFRAN) 4 MG tablet Take 4 mg by  mouth every 8 (eight) hours as needed for nausea or vomiting.     . potassium chloride (KLOR-CON) 20 MEQ packet      No current facility-administered medications for this visit.    OBJECTIVE:  Filed Vitals:   03/03/15 1607  BP: 177/72  Pulse: 73  Temp: 95.8 F (35.4 C)     Body mass index is 15.8 kg/(m^2).    ECOG FS:2 - Symptomatic, <50% confined to bed  PHYSICAL EXAM: Gen. status: Patient appears to be in mild distress.  Poor performance status Lungs: Crepitation at both bases.  Diminished Center entry. Cardiac: Tachycardia Abdomen: Soft tenderness in epigastric area.  No palpable masses. lower extremity no edema. Skin: No rash. Neurological system no headache no dizziness.  Increasing confusion disorientation. No other focal sign     LAB RESULTS:  Appointment on 03/03/2015  Component Date Value Ref Range Status  . Potassium 03/03/2015 4.2  3.5 - 5.1 mmol/L Final   Performed at [facility]    No results found for: LABCA2 No results found for: CA199 Lab Results  Component Value Date   CEA 0.6 07/27/2012     STUDIES:   ASSESSMENT: 81 year old lady with a declining performance status. Difficulty maintaining balance patient would have neurological evaluation planned PET scan would be done to rule out any progressive cancer    MEDICAL DECISION MAKING:\ I will lab data has been reviewed  Schedule PET scan Neurological evaluation Total duration of visit was 45 minutes.  50% or more time was spent in counseling patient and family regarding prognosis and options of treatment and available resources  Patient expressed understanding and was in agreement with this plan. She also understands that She can call clinic at any time with any questions, concerns, or complaints.    No matching staging information was found for the patient.  Forest Gleason, MD   03/22/2015 8:04 AM

## 2015-03-25 DEATH — deceased

## 2015-04-15 ENCOUNTER — Other Ambulatory Visit: Payer: Self-pay | Admitting: *Deleted

## 2015-04-15 DIAGNOSIS — C169 Malignant neoplasm of stomach, unspecified: Secondary | ICD-10-CM
